# Patient Record
Sex: Female | Born: 1970 | Race: White | Hispanic: Yes | Marital: Married | Smoking: Never smoker
Health system: Southern US, Community
[De-identification: ages and names within clinical notes are randomized; demographics above are authoritative.]

## PROBLEM LIST (undated history)

## (undated) DIAGNOSIS — Z87442 Personal history of urinary calculi: Secondary | ICD-10-CM

## (undated) DIAGNOSIS — G4733 Obstructive sleep apnea (adult) (pediatric): Secondary | ICD-10-CM

## (undated) DIAGNOSIS — Z860100 Personal history of colon polyps, unspecified: Secondary | ICD-10-CM

## (undated) DIAGNOSIS — R7612 Nonspecific reaction to cell mediated immunity measurement of gamma interferon antigen response without active tuberculosis: Secondary | ICD-10-CM

## (undated) DIAGNOSIS — K635 Polyp of colon: Secondary | ICD-10-CM

## (undated) DIAGNOSIS — E039 Hypothyroidism, unspecified: Secondary | ICD-10-CM

## (undated) DIAGNOSIS — D259 Leiomyoma of uterus, unspecified: Secondary | ICD-10-CM

## (undated) DIAGNOSIS — N83209 Unspecified ovarian cyst, unspecified side: Secondary | ICD-10-CM

## (undated) DIAGNOSIS — N809 Endometriosis, unspecified: Secondary | ICD-10-CM

## (undated) DIAGNOSIS — T7840XA Allergy, unspecified, initial encounter: Secondary | ICD-10-CM

## (undated) DIAGNOSIS — E559 Vitamin D deficiency, unspecified: Secondary | ICD-10-CM

## (undated) DIAGNOSIS — E785 Hyperlipidemia, unspecified: Secondary | ICD-10-CM

## (undated) DIAGNOSIS — K219 Gastro-esophageal reflux disease without esophagitis: Secondary | ICD-10-CM

## (undated) DIAGNOSIS — J302 Other seasonal allergic rhinitis: Secondary | ICD-10-CM

## (undated) DIAGNOSIS — R0602 Shortness of breath: Secondary | ICD-10-CM

## (undated) DIAGNOSIS — E7800 Pure hypercholesterolemia, unspecified: Secondary | ICD-10-CM

## (undated) DIAGNOSIS — R102 Pelvic and perineal pain unspecified side: Secondary | ICD-10-CM

## (undated) DIAGNOSIS — Z86018 Personal history of other benign neoplasm: Secondary | ICD-10-CM

## (undated) DIAGNOSIS — N390 Urinary tract infection, site not specified: Secondary | ICD-10-CM

## (undated) DIAGNOSIS — K921 Melena: Secondary | ICD-10-CM

## (undated) DIAGNOSIS — N200 Calculus of kidney: Secondary | ICD-10-CM

## (undated) DIAGNOSIS — D219 Benign neoplasm of connective and other soft tissue, unspecified: Secondary | ICD-10-CM

## (undated) HISTORY — DX: Personal history of colon polyps, unspecified: Z86.0100

## (undated) HISTORY — DX: Gastro-esophageal reflux disease without esophagitis: K21.9

## (undated) HISTORY — DX: Personal history of other benign neoplasm: Z86.018

## (undated) HISTORY — DX: Personal history of urinary calculi: Z87.442

## (undated) HISTORY — DX: Allergy, unspecified, initial encounter: T78.40XA

## (undated) HISTORY — DX: Vitamin D deficiency, unspecified: E55.9

## (undated) HISTORY — DX: Hypothyroidism, unspecified: E03.9

## (undated) HISTORY — DX: Polyp of colon: K63.5

## (undated) HISTORY — DX: Shortness of breath: R06.02

## (undated) HISTORY — DX: Pure hypercholesterolemia, unspecified: E78.00

## (undated) HISTORY — DX: Personal history of colonic polyps: Z86.010

## (undated) HISTORY — DX: Hyperlipidemia, unspecified: E78.5

## (undated) HISTORY — DX: Morbid (severe) obesity due to excess calories: E66.01

## (undated) HISTORY — PX: DILATION AND CURETTAGE OF UTERUS: SHX78

## (undated) HISTORY — DX: Endometriosis, unspecified: N80.9

## (undated) HISTORY — DX: Urinary tract infection, site not specified: N39.0

## (undated) HISTORY — PX: UTERINE FIBROID EMBOLIZATION: SHX825

## (undated) HISTORY — DX: Melena: K92.1

## (undated) HISTORY — DX: Benign neoplasm of connective and other soft tissue, unspecified: D21.9

## (undated) HISTORY — DX: Other seasonal allergic rhinitis: J30.2

## (undated) HISTORY — DX: Nonspecific reaction to cell mediated immunity measurement of gamma interferon antigen response without active tuberculosis: R76.12

## (undated) HISTORY — DX: Calculus of kidney: N20.0

---

## 1999-01-16 HISTORY — PX: TUBAL LIGATION: SHX77

## 2005-01-15 HISTORY — PX: NASAL SINUS SURGERY: SHX719

## 2012-01-16 HISTORY — PX: UTERINE FIBROID EMBOLIZATION: SHX825

## 2013-07-22 ENCOUNTER — Ambulatory Visit (INDEPENDENT_AMBULATORY_CARE_PROVIDER_SITE_OTHER): Payer: 59 | Admitting: Family Medicine

## 2013-07-22 ENCOUNTER — Encounter: Payer: Self-pay | Admitting: Family Medicine

## 2013-07-22 VITALS — BP 122/84 | HR 72 | Temp 98.5°F | Ht 61.0 in | Wt 217.5 lb

## 2013-07-22 DIAGNOSIS — Z7689 Persons encountering health services in other specified circumstances: Secondary | ICD-10-CM

## 2013-07-22 DIAGNOSIS — Z7189 Other specified counseling: Secondary | ICD-10-CM

## 2013-07-22 DIAGNOSIS — E039 Hypothyroidism, unspecified: Secondary | ICD-10-CM

## 2013-07-22 DIAGNOSIS — N939 Abnormal uterine and vaginal bleeding, unspecified: Secondary | ICD-10-CM

## 2013-07-22 DIAGNOSIS — N926 Irregular menstruation, unspecified: Secondary | ICD-10-CM

## 2013-07-22 DIAGNOSIS — K625 Hemorrhage of anus and rectum: Secondary | ICD-10-CM

## 2013-07-22 DIAGNOSIS — E669 Obesity, unspecified: Secondary | ICD-10-CM

## 2013-07-22 LAB — CBC WITH DIFFERENTIAL/PLATELET
BASOS ABS: 0 10*3/uL (ref 0.0–0.1)
BASOS PCT: 0 % (ref 0–1)
EOS ABS: 0.1 10*3/uL (ref 0.0–0.7)
Eosinophils Relative: 2 % (ref 0–5)
HCT: 40 % (ref 36.0–46.0)
Hemoglobin: 13.6 g/dL (ref 12.0–15.0)
Lymphocytes Relative: 28 % (ref 12–46)
Lymphs Abs: 1.4 10*3/uL (ref 0.7–4.0)
MCH: 28.9 pg (ref 26.0–34.0)
MCHC: 34 g/dL (ref 30.0–36.0)
MCV: 85.1 fL (ref 78.0–100.0)
Monocytes Absolute: 0.5 10*3/uL (ref 0.1–1.0)
Monocytes Relative: 10 % (ref 3–12)
NEUTROS PCT: 60 % (ref 43–77)
Neutro Abs: 3.1 10*3/uL (ref 1.7–7.7)
Platelets: 317 10*3/uL (ref 150–400)
RBC: 4.7 MIL/uL (ref 3.87–5.11)
RDW: 13.2 % (ref 11.5–15.5)
WBC: 5.1 10*3/uL (ref 4.0–10.5)

## 2013-07-22 NOTE — Progress Notes (Signed)
No chief complaint on file.   HPI:  Kelli Ferrell is here to establish care.  Last PCP and physical: may 2014 - last mammo last year, last pap in 2013 - all normal in the past; is going to see gyn for physical  Has the following chronic problems and concerns today:  There are no active problems to display for this patient.  Hypothyroidism: -restarted levothyroxine in 2014, remote history of being on thyroid medicine in the past too, then off for several years -reports thyroid levels normal 1 year ago -denies: CP, constipation, skin changes, palpitations, hot/cold intolerance  Hx abnormal menstrual bleeding: -extensive evaluation in the past seeing gyn for this  Hx of painless BRBPR: -a lot of blood on TP and coating stool without BM or pain -has had this intermittently, several days last week more then usual -denies: NVD, constipation, weigh loss, fevers, hemorrhoids, rectal pain -occ constipation and hard stools/straining - but not when this occurred last week  ROS negative for unless reported above: fevers, unintentional weight loss, hearing or vision loss, chest pain, palpitations, struggling to breath, hemoptysis, hematuria, fals, loc, si, thoughts of self harm  Past Medical History  Diagnosis Date  . Hypothyroidism   . Kidney stones   . Blood in stool   . Urinary tract infection     Family History  Problem Relation Age of Onset  . Diabetes Mother   . Stroke Mother   . Hypertension Mother   . Diabetes Father   . Diabetes Brother     History   Social History  . Marital Status: Married    Spouse Name: N/A    Number of Children: N/A  . Years of Education: N/A   Social History Main Topics  . Smoking status: Never Smoker   . Smokeless tobacco: None  . Alcohol Use: Yes     Comment: 1 drink rarely  . Drug Use: No  . Sexual Activity: Yes    Birth Control/ Protection: Other-see comments     Comment: tubal   Other Topics Concern  . None   Social  History Narrative   Work or School: [employer] Situation: lives with husband and two children      Spiritual Beliefs: Christian      Lifestyle: regular exercise and healthy diet             Current outpatient prescriptions:levothyroxine (SYNTHROID, LEVOTHROID) 100 MCG tablet, Take 100 mcg by mouth daily before breakfast., Disp: , Rfl:   EXAM:  Filed Vitals:   07/22/13 0936  BP: 122/84  Pulse: 72  Temp: 98.5 F (36.9 C)    Body mass index is 41.12 kg/(m^2).  GENERAL: vitals reviewed and listed above, alert, oriented, appears well hydrated and in no acute distress  HEENT: atraumatic, conjunttiva clear, no obvious abnormalities on inspection of external nose and ears  NECK: no obvious masses on inspection  LUNGS: clear to auscultation bilaterally, no wheezes, rales or rhonchi, good air movement  CV: HRRR, no peripheral edema  RECTUM: normal exam, hemocult neg, hard stool in rectal vault  MS: moves all extremities without noticeable abnormality  PSYCH: pleasant and cooperative, no obvious depression or anxiety  ASSESSMENT AND PLAN:  Discussed the following assessment and plan:  Encounter to establish care  Unspecified hypothyroidism - Plan: TSH  BRBPR (bright red blood per rectum) - Plan: CBC with Differential, Ambulatory referral to Gastroenterology  Abnormal bleeding in menstrual cycle  Obesity, unspecified -  Plan: Lipid Panel, Hemoglobin A1c   -We reviewed the PMH, PSH, FH, SH, Meds and Allergies. -We provided refills for any medications we will prescribe as needed. -We addressed current concerns per orders and patient instructions. -We have asked for records for pertinent exams, studies, vaccines and notes from previous providers. -We have advised patient to follow up per instructions below.   -Patient advised to return or notify a doctor immediately if symptoms worsen or persist or new concerns arise.  Patient  Instructions  -We have ordered labs or studies at this visit. It can take up to 1-2 weeks for results and processing. We will contact you with instructions IF your results are abnormal. Normal results will be released to your [facility]. If you have not heard from [place] or can not find your results in [facility] in 2 weeks please contact our office.  -We placed a referral for you as discussed to the gastroenterologist for the bleeding. It usually takes about 1-2 weeks to process and schedule this referral. If you have not heard from [place] regarding this appointment in 2 weeks please contact our office.   -PLEASE SIGN UP FOR MYCHART TODAY   We recommend the following healthy lifestyle measures: - eat a healthy diet consisting of lots of vegetables, fruits, beans, nuts, seeds, healthy meats such as white chicken and fish and whole grains.  - avoid fried foods, fast food, processed foods, sodas, red meet and other fattening foods.  - get a least 150 minutes of aerobic exercise per week.   Follow up in: 3-4 months or as needed      Aylan Bayona R.

## 2013-07-22 NOTE — Addendum Note (Signed)
Addended by: Joyce Gross R on: 07/22/2013 10:24 AM   Modules accepted: Orders

## 2013-07-22 NOTE — Progress Notes (Signed)
Pre visit review using our clinic review tool, if applicable. No additional management support is needed unless otherwise documented below in the visit note. 

## 2013-07-22 NOTE — Patient Instructions (Signed)
-  We have ordered labs or studies at this visit. It can take up to 1-2 weeks for results and processing. We will contact you with instructions IF your results are abnormal. Normal results will be released to your [facility]. If you have not heard from [place] or can not find your results in [facility] in 2 weeks please contact our office.  -We placed a referral for you as discussed to the gastroenterologist for the bleeding. It usually takes about 1-2 weeks to process and schedule this referral. If you have not heard from [place] regarding this appointment in 2 weeks please contact our office.   -PLEASE SIGN UP FOR MYCHART TODAY   We recommend the following healthy lifestyle measures: - eat a healthy diet consisting of lots of vegetables, fruits, beans, nuts, seeds, healthy meats such as white chicken and fish and whole grains.  - avoid fried foods, fast food, processed foods, sodas, red meet and other fattening foods.  - get a least 150 minutes of aerobic exercise per week.   Follow up in: 3-4 months or as needed

## 2013-07-23 LAB — LIPID PANEL
CHOL/HDL RATIO: 4.8 ratio
Cholesterol: 159 mg/dL (ref 0–200)
HDL: 33 mg/dL — ABNORMAL LOW (ref >39)
LDL Cholesterol: 90 mg/dL (ref 0–99)
Triglycerides: 179 mg/dL — ABNORMAL HIGH (ref <150)
VLDL: 36 mg/dL (ref 0–40)

## 2013-07-23 LAB — HEMOGLOBIN A1C
Hgb A1c MFr Bld: 5.4 % (ref <5.7)
Mean Plasma Glucose: 108 mg/dL (ref <117)

## 2013-07-23 LAB — TSH: TSH: 3.774 u[IU]/mL (ref 0.350–4.500)

## 2013-08-17 ENCOUNTER — Encounter: Payer: Self-pay | Admitting: Family Medicine

## 2013-10-28 ENCOUNTER — Encounter: Payer: Self-pay | Admitting: Physician Assistant

## 2013-10-28 ENCOUNTER — Ambulatory Visit (INDEPENDENT_AMBULATORY_CARE_PROVIDER_SITE_OTHER): Payer: 59 | Admitting: Physician Assistant

## 2013-10-28 VITALS — BP 120/80 | HR 60 | Temp 98.1°F | Resp 18 | Wt 216.2 lb

## 2013-10-28 DIAGNOSIS — L259 Unspecified contact dermatitis, unspecified cause: Secondary | ICD-10-CM

## 2013-10-28 MED ORDER — PREDNISONE 10 MG PO TABS: ORAL_TABLET | ORAL | Status: DC

## 2013-10-28 NOTE — Progress Notes (Signed)
Subjective:    Patient ID: Kelli Ferrell, female    DOB: 1970/03/28, 43 y.o.   MRN: [id number]  Kelli Ferrell This is a new problem. The current episode started more than 1 month ago (started 1 month ago, went to UC initially.). The problem has been gradually worsening since onset. The rash is diffuse (chest, back, arms, abd, legs.). The rash is characterized by redness, itchiness and blistering. She was exposed to plant contact. Pertinent negatives include no anorexia, congestion, cough, diarrhea, eye pain, facial edema, fatigue, fever, joint pain, nail changes, rhinorrhea, shortness of breath, sore throat or vomiting. Treatments tried: benadryl, hydroxyzine, cortisone, calamine, 12 days course of prednisone. The treatment provided mild relief. There is no history of allergies, asthma or eczema.      Review of Systems  Constitutional: Negative for fever, chills and fatigue.  HENT: Negative for congestion, rhinorrhea and sore throat.   Eyes: Negative for pain.  Respiratory: Negative for cough and shortness of breath.   Cardiovascular: Negative for chest pain.  Gastrointestinal: Negative for nausea, vomiting, diarrhea and anorexia.  Musculoskeletal: Negative for joint pain.  Skin: Negative for nail changes.  Neurological: Negative for syncope and headaches.  All other systems reviewed and are negative.    Past Medical History  Diagnosis Date  . Hypothyroidism   . Kidney stones   . Blood in stool   . Urinary tract infection     History   Social History  . Marital Status: Married    Spouse Name: N/A    Number of Children: N/A  . Years of Education: N/A   Occupational History  . Not on file.   Social History Main Topics  . Smoking status: Never Smoker   . Smokeless tobacco: Not on file  . Alcohol Use: Yes     Comment: 1 drink rarely  . Drug Use: No  . Sexual Activity: Yes    Birth Control/ Protection: Other-see comments     Comment: tubal   Other Topics Concern    . 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 Dilation and curettage of uterus    . Uterine fibroid embolization      Family History  Problem Relation Age of Onset  . Diabetes Mother   . Stroke Mother   . Hypertension Mother   . Diabetes Father   . Diabetes Brother     No Known Allergies  Current Outpatient Prescriptions on File Prior to Visit  Medication Sig Dispense Refill  . levothyroxine (SYNTHROID, LEVOTHROID) 100 MCG tablet Take 100 mcg by mouth daily before breakfast.       No current facility-administered medications on file prior to visit.    EXAM: BP 120/80  Pulse 60  Temp(Src) 98.1 F (36.7 C) (Oral)  Resp 18  Wt 216 lb 3.2 oz (98.068 kg)     Objective:   Physical Exam  Nursing note and vitals reviewed. Constitutional: She is oriented to person, place, and time. She appears well-developed and well-nourished. No distress.  HENT:  Head: Normocephalic and atraumatic.  Eyes: Conjunctivae and EOM are normal.  Cardiovascular: Normal rate, regular rhythm and intact  distal pulses.   Pulmonary/Chest: Effort normal and breath sounds normal. No respiratory distress. She exhibits no tenderness.  Neurological: She is alert and oriented to person, place, and time.  Skin: Skin is warm and dry. Rash noted. She is not diaphoretic. No pallor.  Diffuse rash with occasional blistering covering the chest, back, arms, legs. Non ttp, no swelling. No fluctuance.  Psychiatric: She has a normal mood and affect. Her behavior is normal. Judgment and thought content normal.     Lab Results  Component Value Date   WBC 5.1 07/22/2013   HGB 13.6 07/22/2013   HCT 40.0 07/22/2013   PLT 317 07/22/2013   CHOL 159 07/22/2013   TRIG  179* 07/22/2013   HDL 33* 07/22/2013   LDLCALC 90 07/22/2013   TSH 3.774 07/22/2013   HGBA1C 5.4 07/22/2013        Assessment & Plan:  Kelli Ferrell was seen today for Kelli ivy.  Diagnoses and associated orders for this visit:  Contact dermatitis Comments: from Kelli ivy. Treat with another course of prednisone, watchful waiting. - predniSONE (DELTASONE) 10 MG tablet; 3 tablets twice daily for 3 days, then 2 tablets twice daily for 3 days,  then 1 tablet twice daily for 2 days, then one tablet daily  for 6 days    Return precautions provided, and patient handout on contact dermatitis.  Plan to follow up as needed, or for worsening or persistent symptoms despite treatment.  Patient Instructions  Prednisone as directed.  Continue try the topicals until you see relief from the prednisone.  If emergency symptoms discussed during visit developed, seek medical attention immediately.  Followup as needed, or for worsening or persistent symptoms despite treatment.

## 2013-10-28 NOTE — Patient Instructions (Addendum)
Prednisone as directed.  Continue try the topicals until you see relief from the prednisone.  If emergency symptoms discussed during visit developed, seek medical attention immediately.  Followup as needed, or for worsening or persistent symptoms despite treatment.   Contact Dermatitis Contact dermatitis is a rash that happens when something touches the skin. You touched something that irritates your skin, or you have allergies to something you touched. HOME CARE   Avoid the thing that caused your rash.  Keep your rash away from hot water, soap, sunlight, chemicals, and other things that might bother it.  Do not scratch your rash.  You can take cool baths to help stop itching.  Only take medicine as told by your doctor.  Keep all doctor visits as told. GET HELP RIGHT AWAY IF:   Your rash is not better after 3 days.  Your rash gets worse.  Your rash is puffy (swollen), tender, red, sore, or warm.  You have problems with your medicine. MAKE SURE YOU:   Understand these instructions.  Will watch your condition.  Will get help right away if you are not doing well or get worse. Document Released: 10/29/2008 Document Revised: 03/26/2011 Document Reviewed: 06/06/2010 [facility] Patient Information 2015 [place], [place]. This information is not intended to replace advice given to you by your health care provider. Make sure you discuss any questions you have with your health care provider.

## 2013-10-28 NOTE — Progress Notes (Signed)
Pre visit review using our clinic review tool, if applicable. No additional management support is needed unless otherwise documented below in the visit note. 

## 2014-03-03 ENCOUNTER — Other Ambulatory Visit (INDEPENDENT_AMBULATORY_CARE_PROVIDER_SITE_OTHER): Payer: Self-pay | Admitting: Otolaryngology

## 2014-03-03 DIAGNOSIS — J329 Chronic sinusitis, unspecified: Secondary | ICD-10-CM

## 2014-03-04 ENCOUNTER — Ambulatory Visit
Admission: RE | Admit: 2014-03-04 | Discharge: 2014-03-04 | Disposition: A | Discharge status: Home / Self Care | Payer: 59 | Source: Ambulatory Visit | Attending: Otolaryngology | Admitting: Otolaryngology

## 2014-03-04 DIAGNOSIS — J329 Chronic sinusitis, unspecified: Secondary | ICD-10-CM

## 2014-07-13 ENCOUNTER — Ambulatory Visit (INDEPENDENT_AMBULATORY_CARE_PROVIDER_SITE_OTHER): Payer: Commercial Managed Care - HMO | Admitting: Family Medicine

## 2014-07-13 ENCOUNTER — Encounter: Payer: Self-pay | Admitting: Family Medicine

## 2014-07-13 VITALS — BP 118/88 | HR 65 | Temp 98.5°F | Ht 61.0 in | Wt 211.0 lb

## 2014-07-13 DIAGNOSIS — R739 Hyperglycemia, unspecified: Secondary | ICD-10-CM

## 2014-07-13 DIAGNOSIS — E039 Hypothyroidism, unspecified: Secondary | ICD-10-CM

## 2014-07-13 DIAGNOSIS — E669 Obesity, unspecified: Secondary | ICD-10-CM | POA: Diagnosis not present

## 2014-07-13 DIAGNOSIS — E785 Hyperlipidemia, unspecified: Secondary | ICD-10-CM | POA: Diagnosis not present

## 2014-07-13 LAB — LIPID PANEL
Cholesterol: 149 mg/dL (ref 0–200)
HDL: 30.4 mg/dL — ABNORMAL LOW (ref >39.00)
LDL CALC: 91 mg/dL (ref 0–99)
NonHDL: 118.6
TRIGLYCERIDES: 139 mg/dL (ref 0.0–149.0)
Total CHOL/HDL Ratio: 5
VLDL: 27.8 mg/dL (ref 0.0–40.0)

## 2014-07-13 LAB — HEMOGLOBIN A1C: HEMOGLOBIN A1C: 5.3 % (ref 4.6–6.5)

## 2014-07-13 LAB — TSH: TSH: 2.75 u[IU]/mL (ref 0.35–4.50)

## 2014-07-13 NOTE — Progress Notes (Signed)
  HPI:  Hypothyroidism: -restarted levothyroxine in 2014, remote history of being on thyroid medicine in the past too, then off for several years -taking 12mcg daily on empty stomach -denies: CP, constipation, skin changes, palpitations, hot/cold intolerance  Obesity/HLD: -working on exercise; working fruits and veggies, portion control -has lost 6lbs in the last few months -wants to check hgba1c, fh diabetes -denies: polyuria, polydipsia, vision changes   ROS: See pertinent positives and negatives per HPI.  Past Medical History  Diagnosis Date  . Hypothyroidism   . Kidney stones   . Blood in stool   . Urinary tract infection     Past Surgical History  Procedure Laterality Date  . Dilation and curettage of uterus    . Uterine fibroid embolization      Family History  Problem Relation Age of Onset  . Diabetes Mother   . Stroke Mother   . Hypertension Mother   . Diabetes Father   . Diabetes Brother     History   Social History  . Marital Status: Married    Spouse Name: N/A  . Number of Children: N/A  . Years of Education: N/A   Social History Main Topics  . Smoking status: Never Smoker   . Smokeless tobacco: Not on file  . Alcohol Use: Yes     Comment: 1 drink rarely  . Drug Use: No  . Sexual Activity: Yes    Birth Control/ Protection: Other-see comments     Comment: tubal   Other Topics Concern  . None   Social History Narrative   Work or School: [employer] Situation: lives with husband and two children      Spiritual Beliefs: Christian      Lifestyle: regular exercise and healthy diet              Current outpatient prescriptions:  .  levothyroxine (SYNTHROID, LEVOTHROID) 100 MCG tablet, Take 100 mcg by mouth daily before breakfast., Disp: , Rfl:   EXAM:  Filed Vitals:   07/13/14 0910  BP: 118/88  Pulse: 65  Temp: 98.5 F (36.9 C)    Body mass index is 39.89 kg/(m^2).  GENERAL: vitals reviewed  and listed above, alert, oriented, appears well hydrated and in no acute distress  HEENT: atraumatic, conjunttiva clear, no obvious abnormalities on inspection of external nose and ears  NECK: no obvious masses on inspection  LUNGS: clear to auscultation bilaterally, no wheezes, rales or rhonchi, good air movement  CV: HRRR, no peripheral edema  MS: moves all extremities without noticeable abnormality  PSYCH: pleasant and cooperative, no obvious depression or anxiety  ASSESSMENT AND PLAN:  Discussed the following assessment and plan:  Hypothyroidism, unspecified hypothyroidism type - Plan: TSH  Obesity  Hyperlipemia - Plan: Lipid Panel  Hyperglycemia - Plan: Hemoglobin A1c  -FASTING labs today -adjust synthroid if needed -follow up in 6 months -Patient advised to return or notify a doctor immediately if symptoms worsen or persist or new concerns arise.  Patient Instructions  BEFORE YOU LEAVE: -labs -follow up as scheduled     Vineeth Fell R.

## 2014-07-13 NOTE — Progress Notes (Signed)
Pre visit review using our clinic review tool, if applicable. No additional management support is needed unless otherwise documented below in the visit note. 

## 2014-07-13 NOTE — Patient Instructions (Addendum)
BEFORE YOU LEAVE: -labs -follow up as scheduled  -We have ordered labs or studies at this visit. It can take up to 1-2 weeks for results and processing. We will contact you with instructions IF your results are abnormal. Normal results will be released to your [facility]. If you have not heard from [place] or can not find your results in [facility] in 2 weeks please contact our office.  We recommend the following healthy lifestyle measures: - eat a healthy diet consisting of lots of vegetables, fruits, beans, nuts, seeds, healthy meats such as white chicken and fish and whole grains.  - avoid fried foods, fast food, processed foods, sodas, red meet and other fattening foods.  - get a least 150 minutes of aerobic exercise per week.

## 2014-07-14 MED ORDER — LEVOTHYROXINE SODIUM 100 MCG PO TABS: 100.0000 ug | ORAL_TABLET | Freq: Every day | ORAL | Status: DC

## 2014-07-14 NOTE — Addendum Note (Signed)
Addended by: Agnes Lawrence on: 07/14/2014 08:14 AM   Modules accepted: Orders

## 2014-09-16 ENCOUNTER — Other Ambulatory Visit: Payer: Self-pay | Admitting: Family Medicine

## 2014-10-01 ENCOUNTER — Other Ambulatory Visit: Payer: Self-pay | Admitting: Obstetrics and Gynecology

## 2014-10-01 DIAGNOSIS — R928 Other abnormal and inconclusive findings on diagnostic imaging of breast: Secondary | ICD-10-CM

## 2014-10-04 ENCOUNTER — Ambulatory Visit (INDEPENDENT_AMBULATORY_CARE_PROVIDER_SITE_OTHER): Payer: Commercial Managed Care - HMO | Admitting: Family Medicine

## 2014-10-04 ENCOUNTER — Encounter: Payer: Self-pay | Admitting: Family Medicine

## 2014-10-04 VITALS — BP 136/80 | HR 92 | Temp 97.9°F | Ht 61.0 in

## 2014-10-04 DIAGNOSIS — L500 Allergic urticaria: Secondary | ICD-10-CM

## 2014-10-04 MED ORDER — EPINEPHRINE 0.3 MG/0.3ML IJ SOAJ: 0.3000 mg | Freq: Once | INTRAMUSCULAR | Status: DC

## 2014-10-04 NOTE — Patient Instructions (Signed)
Pepcid daily  Continue steroid course to complete  Call for appointment with allergist  Seek emergency care immediately if difficulty breathing, swelling of throat, neck or face, gastrointestinal distress with severe allergic reaction  Hives Hives are itchy, red, swollen areas of the skin. They can vary in size and location on your body. Hives can come and go for hours or several days (acute hives) or for several weeks (chronic hives). Hives do not spread from person to person (noncontagious). They may get worse with scratching, exercise, and emotional stress. CAUSES   Allergic reaction to food, additives, or drugs.  Infections, including the common cold.  Illness, such as vasculitis, lupus, or thyroid disease.  Exposure to sunlight, heat, or cold.  Exercise.  Stress.  Contact with chemicals. SYMPTOMS   Red or white swollen patches on the skin. The patches may change size, shape, and location quickly and repeatedly.  Itching.  Swelling of the hands, feet, and face. This may occur if hives develop deeper in the skin. DIAGNOSIS  Your caregiver can usually tell what is wrong by performing a physical exam. Skin or blood tests may also be done to determine the cause of your hives. In some cases, the cause cannot be determined. TREATMENT  Mild cases usually get better with medicines such as antihistamines. Severe cases may require an emergency epinephrine injection. If the cause of your hives is known, treatment includes avoiding that trigger.  HOME CARE INSTRUCTIONS   Avoid causes that trigger your hives.  Take antihistamines as directed by your caregiver to reduce the severity of your hives. Non-sedating or low-sedating antihistamines are usually recommended. Do not drive while taking an antihistamine.  Take any other medicines prescribed for itching as directed by your caregiver.  Wear loose-fitting clothing.  Keep all follow-up appointments as directed by your  caregiver. SEEK MEDICAL CARE IF:   You have persistent or severe itching that is not relieved with medicine.  You have painful or swollen joints. SEEK IMMEDIATE MEDICAL CARE IF:   You have a fever.  Your tongue or lips are swollen.  You have trouble breathing or swallowing.  You feel tightness in the throat or chest.  You have abdominal pain. These problems may be the first sign of a life-threatening allergic reaction. Call your local emergency services (911 in [place] MAKE SURE YOU:   Understand these instructions.  Will watch your condition.  Will get help right away if you are not doing well or get worse. Document Released: 01/01/2005 Document Revised: 01/06/2013 Document Reviewed: 03/27/2011 [facility] Patient Information 2015 [place], [place]. This information is not intended to replace advice given to you by your health care provider. Make sure you discuss any questions you have with your health care provider.

## 2014-10-04 NOTE — Progress Notes (Signed)
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  Past Medical History  Diagnosis Date  . Hypothyroidism   . Kidney stones   . Blood in stool   . Urinary tract infection     Past Surgical History  Procedure Laterality Date  . Dilation and curettage of uterus    . Uterine fibroid embolization      Family History  Problem Relation Age of Onset  . Diabetes Mother   . Stroke Mother   . Hypertension Mother   . Diabetes Father   . Diabetes Brother     Social History   Social History  . Marital Status: Married    Spouse Name: N/A  . Number of Children: N/A  . Years of Education: N/A   Social History Main Topics  . Smoking status: Never Smoker   . Smokeless tobacco: None  . Alcohol Use: Yes     Comment: 1 drink rarely  . Drug Use: No  . Sexual Activity: Yes    Birth Control/ Protection: Other-see comments     Comment: tubal   Other Topics Concern  . None   Social History Narrative   Work or School: [employer] Situation: lives with husband and two children      Spiritual Beliefs: Christian      Lifestyle: regular exercise and healthy diet              Current outpatient prescriptions:  .  diphenhydrAMINE (SOMINEX) 25 MG tablet, Take 25 mg by mouth as needed for sleep., Disp: , Rfl:  .  fluconazole (DIFLUCAN) 150 MG tablet, TK 1 T PO QD, Disp: , Rfl: 0 .  levothyroxine (SYNTHROID, LEVOTHROID) 100 MCG tablet, Take 1  tablet (100 mcg total) by mouth daily before breakfast., Disp: 30 tablet, Rfl: 1 .  methylPREDNISolone (MEDROL DOSEPAK) 4 MG TBPK tablet, See admin instructions. follow package directions, Disp: , Rfl: 0 .  EPINEPHrine (EPIPEN 2-PAK) 0.3 mg/0.3 mL IJ SOAJ injection, Inject 0.3 mLs (0.3 mg total) into the muscle once., Disp: 1 Device, Rfl: 0  EXAM:  Filed Vitals:   10/04/14 0846  BP: 136/80  Pulse: 92  Temp: 97.9 F (36.6 C)    There is no weight on file to calculate BMI.  GENERAL: vitals reviewed and listed above, alert, oriented, appears well hydrated and in no acute distress  HEENT: atraumatic, conjunttiva clear, no obvious abnormalities on inspection of external nose and ears, no swelling of lips, face, tongue or oropharynx, does have clear rhinorrhea, PND  NECK: no obvious masses on inspection, no swelling, no bruit or pseudowheeze  LUNGS: clear to auscultation bilaterally, no wheezes, rales or rhonchi, good air movement  CV: HRRR, no peripheral edema  SKIN: difuse hives  MS: moves all extremities without noticeable abnormality  PSYCH: pleasant and cooperative, no obvious depression or anxiety  ASSESSMENT AND PLAN:  Discussed the following assessment and plan:  Allergic urticaria  -etiology unknown -advised avoidance of common allergens for now, pepcid,  complete steroid course, follow up with allergist -rx for epipen though advised of risks and proper use and advised to call 911 if serious reaction requiring epipen  -emergency recs -Patient advised to return or notify a doctor immediately if symptoms worsen or persist or new concerns arise.  Patient Instructions  Pepcid daily  Continue steroid course to complete  Call for appointment with allergist  Seek emergency care immediately if difficulty breathing, swelling of throat, neck or face, gastrointestinal distress with severe allergic reaction  Hives Hives are itchy, red, swollen areas of the skin. They can  vary in size and location on your body. Hives can come and go for hours or several days (acute hives) or for several weeks (chronic hives). Hives do not spread from person to person (noncontagious). They may get worse with scratching, exercise, and emotional stress. CAUSES   Allergic reaction to food, additives, or drugs.  Infections, including the common cold.  Illness, such as vasculitis, lupus, or thyroid disease.  Exposure to sunlight, heat, or cold.  Exercise.  Stress.  Contact with chemicals. SYMPTOMS   Red or white swollen patches on the skin. The patches may change size, shape, and location quickly and repeatedly.  Itching.  Swelling of the hands, feet, and face. This may occur if hives develop deeper in the skin. DIAGNOSIS  Your caregiver can usually tell what is wrong by performing a physical exam. Skin or blood tests may also be done to determine the cause of your hives. In some cases, the cause cannot be determined. TREATMENT  Mild cases usually get better with medicines such as antihistamines. Severe cases may require an emergency epinephrine injection. If the cause of your hives is known, treatment includes avoiding that trigger.  HOME CARE INSTRUCTIONS   Avoid causes that trigger your hives.  Take antihistamines as directed by your caregiver to reduce the severity of your hives. Non-sedating or low-sedating antihistamines are usually recommended. Do not drive while taking an antihistamine.  Take any other medicines prescribed for itching as directed by your caregiver.  Wear loose-fitting clothing.  Keep all follow-up appointments as directed by your caregiver. SEEK MEDICAL CARE IF:   You have persistent or severe itching that is not relieved with medicine.  You have painful or swollen joints. SEEK IMMEDIATE MEDICAL CARE IF:   You have a fever.  Your tongue or lips are swollen.  You have trouble breathing or swallowing.  You feel tightness in the  throat or chest.  You have abdominal pain. These problems may be the first sign of a life-threatening allergic reaction. Call your local emergency services (911 in [place] MAKE SURE YOU:   Understand these instructions.  Will watch your condition.  Will get help right away if you are not doing well or get worse. Document Released: 01/01/2005 Document Revised: 01/06/2013 Document Reviewed: 03/27/2011 [facility] Patient Information 2015 [place], [place]. This information is not intended to replace advice given to you by your health care provider. Make sure you discuss any questions you have with your health care provider.        Colin Benton R.

## 2014-10-04 NOTE — Progress Notes (Signed)
Pre visit review using our clinic review tool, if applicable. No additional management support is needed unless otherwise documented below in the visit note. 

## 2014-10-11 ENCOUNTER — Ambulatory Visit
Admission: RE | Admit: 2014-10-11 | Discharge: 2014-10-11 | Disposition: A | Discharge status: Home / Self Care | Payer: Commercial Managed Care - HMO | Source: Ambulatory Visit | Attending: Obstetrics and Gynecology | Admitting: Obstetrics and Gynecology

## 2014-10-11 ENCOUNTER — Other Ambulatory Visit: Payer: Self-pay | Admitting: Obstetrics and Gynecology

## 2014-10-11 DIAGNOSIS — R928 Other abnormal and inconclusive findings on diagnostic imaging of breast: Secondary | ICD-10-CM

## 2014-10-19 ENCOUNTER — Ambulatory Visit
Admission: RE | Admit: 2014-10-19 | Discharge: 2014-10-19 | Disposition: A | Discharge status: Home / Self Care | Payer: Commercial Managed Care - HMO | Source: Ambulatory Visit | Attending: Obstetrics and Gynecology | Admitting: Obstetrics and Gynecology

## 2014-10-19 ENCOUNTER — Other Ambulatory Visit: Payer: Self-pay | Admitting: Obstetrics and Gynecology

## 2014-10-19 ENCOUNTER — Encounter: Payer: 59 | Admitting: Family Medicine

## 2014-10-19 DIAGNOSIS — R928 Other abnormal and inconclusive findings on diagnostic imaging of breast: Secondary | ICD-10-CM

## 2014-11-18 ENCOUNTER — Ambulatory Visit (INDEPENDENT_AMBULATORY_CARE_PROVIDER_SITE_OTHER): Payer: Commercial Managed Care - HMO | Admitting: Family Medicine

## 2014-11-18 ENCOUNTER — Other Ambulatory Visit: Payer: Self-pay

## 2014-11-18 ENCOUNTER — Encounter: Payer: Self-pay | Admitting: Family Medicine

## 2014-11-18 VITALS — BP 102/80 | HR 71 | Temp 98.6°F | Ht 60.75 in | Wt 216.5 lb

## 2014-11-18 DIAGNOSIS — E039 Hypothyroidism, unspecified: Secondary | ICD-10-CM

## 2014-11-18 DIAGNOSIS — Z23 Encounter for immunization: Secondary | ICD-10-CM

## 2014-11-18 DIAGNOSIS — E785 Hyperlipidemia, unspecified: Secondary | ICD-10-CM

## 2014-11-18 DIAGNOSIS — I519 Heart disease, unspecified: Secondary | ICD-10-CM

## 2014-11-18 DIAGNOSIS — Z0000 Encounter for general adult medical examination without abnormal findings: Secondary | ICD-10-CM | POA: Diagnosis not present

## 2014-11-18 HISTORY — DX: Morbid (severe) obesity due to excess calories: E66.01

## 2014-11-18 HISTORY — DX: Hyperlipidemia, unspecified: E78.5

## 2014-11-18 LAB — TSH: TSH: 1.69 u[IU]/mL (ref 0.35–4.50)

## 2014-11-18 NOTE — Addendum Note (Signed)
Addended by: Gari Crown D on: 11/18/2014 02:14 PM   Modules accepted: Orders

## 2014-11-18 NOTE — Progress Notes (Signed)
HPI:  Here for CPE:  -Concerns and/or follow up today:   Hypothyroidism: -restarted levothyroxine in 2014, remote history of being on thyroid medicine in the past too, then off for several years -taking 181mcg daily on empty stomach -denies: CP, constipation, skin changes, palpitations, hot/cold intolerance  Obesity/HLD: -Diet: variety of foods, balance and well rounded, larger portion sizes -Exercise: walking daily  -Taking folic acid, vitamin D or calcium: no  -Diabetes and Dyslipidemia Screening: done  -Hx of HTN: no  -Vaccines: flu shot today  -pap history: pap 08/2014 with gyn, Dr. Terri Piedra, normal per pt report   -FDLMP: 11/03/14  -sexual activity: yes, female partner, no new partners  -wants STI testing (Hep C if born [birth year]): no  -FH breast, colon or ovarian ca: see FH Last mammogram: about 1 month ago, reports told did not need biopsy when went for biopsy and told to repeat mammo in 6 months   -Alcohol, Tobacco, drug use: see social history  Review of Systems - no fevers, unintentional weight loss, vision loss, hearing loss, chest pain, sob, hemoptysis, melena, hematochezia, hematuria, genital discharge, changing or concerning skin lesions, bleeding, bruising, loc, thoughts of self harm or SI  Past Medical History  Diagnosis Date  . Hypothyroidism   . Kidney stones   . Blood in stool   . Urinary tract infection     Past Surgical History  Procedure Laterality Date  . Dilation and curettage of uterus    . Uterine fibroid embolization      Family History  Problem Relation Age of Onset  . Diabetes Mother   . Stroke Mother   . Hypertension Mother   . Diabetes Father   . Diabetes Brother     Social History   Social History  . Marital Status: Married    Spouse Name: N/A  . Number of Children: N/A  . Years of Education: N/A   Social History Main Topics  . Smoking status: Never Smoker   . Smokeless tobacco: None  . Alcohol Use: Yes     Comment: 1  drink rarely  . Drug Use: No  . Sexual Activity: Yes    Birth Control/ Protection: Other-see comments     Comment: tubal   Other Topics Concern  . None   Social History Narrative   Work or School: [employer] Situation: lives with husband and two children      Spiritual Beliefs: Christian      Lifestyle: regular exercise and healthy diet              Current outpatient prescriptions:  .  EPINEPHrine (EPIPEN 2-PAK) 0.3 mg/0.3 mL IJ SOAJ injection, Inject 0.3 mLs (0.3 mg total) into the muscle once., Disp: 1 Device, Rfl: 0 .  fluticasone (FLONASE) 50 MCG/ACT nasal spray, Place into both nostrils daily., Disp: , Rfl:  .  levothyroxine (SYNTHROID, LEVOTHROID) 100 MCG tablet, Take 1 tablet (100 mcg total) by mouth daily before breakfast., Disp: 30 tablet, Rfl: 1  EXAM:  Filed Vitals:   11/18/14 1326  BP: 102/80  Pulse: 71  Temp: 98.6 F (37 C)   Body mass index is 41.25 kg/(m^2).   GENERAL: vitals reviewed and listed below, alert, oriented, appears well hydrated and in no acute distress  HEENT: head atraumatic, PERRLA, normal appearance of eyes, ears, nose and mouth. moist mucus membranes.  NECK: supple, no masses or lymphadenopathy  LUNGS: clear to auscultation bilaterally, no rales, rhonchi or  wheeze  CV: HRRR, no peripheral edema or cyanosis, normal pedal pulses  BREAST: declined  ABDOMEN: bowel sounds normal, soft, non tender to palpation, no masses, no rebound or guarding  GU: declined  SKIN: declined to get undressed for full skin check, no abnormal lesions seen on exposed areas  MS: normal gait, moves all extremities normally  NEURO: CN II-XII grossly intact, normal muscle strength and sensation to light touch on extremities  PSYCH: normal affect, pleasant and cooperative  ASSESSMENT AND PLAN:  Discussed the following assessment and plan:  Visit for preventive health examination  Hypothyroidism, unspecified  hypothyroidism type  Morbid obesity, unspecified obesity type ([place])  Dyslipidemia   -Discussed and advised all [place] preventive services health task force level A and B recommendations for age, sex and risks.  -Advised at least 150 minutes of exercise per week and a healthy diet low in saturated fats and sweets and consisting of fresh fruits and vegetables, lean meats such as fish and white chicken and whole grains.  -labs, studies and vaccines per orders this encounter  No orders of the defined types were placed in this encounter.    Patient advised to return to clinic immediately if symptoms worsen or persist or new concerns.  Patient Instructions  BEFORE YOU LEAVE: -flu shot -lab - for thyroid check -follow up in 6 months  We recommend the following healthy lifestyle measures: - eat a healthy whole foods diet consisting of regular small meals composed of vegetables, fruits, beans, nuts, seeds, healthy meats such as white chicken and fish and whole grains.  - avoid sweets, white starchy foods, fried foods, fast food, processed foods, sodas, red meet and other fattening foods.  - get a least 150-300 minutes of aerobic exercise per week.   -We have ordered labs or studies at this visit. It can take up to 1-2 weeks for results and processing. We will contact you with instructions IF your results are abnormal. Normal results will be released to your [facility]. If you have not heard from [place] or can not find your results in [facility] in 2 weeks please contact our office.           No Follow-up on file.  Colin Benton R.

## 2014-11-18 NOTE — Addendum Note (Signed)
Addended by: Agnes Lawrence on: 11/18/2014 02:10 PM   Modules accepted: Orders

## 2014-11-18 NOTE — Progress Notes (Signed)
Pre visit review using our clinic review tool, if applicable. No additional management support is needed unless otherwise documented below in the visit note. 

## 2014-11-18 NOTE — Patient Instructions (Signed)
BEFORE YOU LEAVE: -flu shot -lab - for thyroid check -follow up in 6 months  We recommend the following healthy lifestyle measures: - eat a healthy whole foods diet consisting of regular small meals composed of vegetables, fruits, beans, nuts, seeds, healthy meats such as white chicken and fish and whole grains.  - avoid sweets, white starchy foods, fried foods, fast food, processed foods, sodas, red meet and other fattening foods.  - get a least 150-300 minutes of aerobic exercise per week.   -We have ordered labs or studies at this visit. It can take up to 1-2 weeks for results and processing. We will contact you with instructions IF your results are abnormal. Normal results will be released to your [facility]. If you have not heard from [place] or can not find your results in [facility] in 2 weeks please contact our office.

## 2014-11-25 ENCOUNTER — Other Ambulatory Visit: Payer: Self-pay | Admitting: Family Medicine

## 2015-01-16 DIAGNOSIS — Z9289 Personal history of other medical treatment: Secondary | ICD-10-CM

## 2015-01-16 HISTORY — DX: Personal history of other medical treatment: Z92.89

## 2015-04-18 ENCOUNTER — Other Ambulatory Visit: Payer: Self-pay | Admitting: Obstetrics and Gynecology

## 2015-04-18 DIAGNOSIS — N63 Unspecified lump in unspecified breast: Secondary | ICD-10-CM

## 2015-04-28 ENCOUNTER — Ambulatory Visit
Admission: RE | Admit: 2015-04-28 | Discharge: 2015-04-28 | Disposition: A | Discharge status: Home / Self Care | Payer: Commercial Managed Care - HMO | Source: Ambulatory Visit | Attending: Obstetrics and Gynecology | Admitting: Obstetrics and Gynecology

## 2015-04-28 DIAGNOSIS — N63 Unspecified lump in unspecified breast: Secondary | ICD-10-CM

## 2015-09-07 ENCOUNTER — Other Ambulatory Visit: Payer: Self-pay | Admitting: Family Medicine

## 2015-11-15 ENCOUNTER — Other Ambulatory Visit: Payer: Self-pay | Admitting: Family Medicine

## 2015-11-15 ENCOUNTER — Other Ambulatory Visit: Payer: Self-pay | Admitting: Obstetrics and Gynecology

## 2015-11-15 DIAGNOSIS — Z1231 Encounter for screening mammogram for malignant neoplasm of breast: Secondary | ICD-10-CM

## 2015-11-22 ENCOUNTER — Encounter: Payer: Self-pay | Admitting: Family Medicine

## 2015-11-22 ENCOUNTER — Encounter: Payer: Commercial Managed Care - HMO | Admitting: Family Medicine

## 2015-11-22 DIAGNOSIS — J302 Other seasonal allergic rhinitis: Secondary | ICD-10-CM

## 2015-11-22 HISTORY — DX: Other seasonal allergic rhinitis: J30.2

## 2015-11-22 NOTE — Progress Notes (Deleted)
HPI:  Here for CPE:  -Concerns and/or follow up today: She has a PMH significant for hypothyroidism, Obesity, seasonal allergies and Hyperlipidemia. Medications include levothyroxine 158mcg daily and flonase seasonally.   -Diet: variety of foods, balance and well rounded, larger portion sizes  -Exercise: no regular exercise  -Taking folic acid, vitamin D or calcium: no  -Diabetes and Dyslipidemia Screening: FASTING for labs today  -Vaccines: reviewed  -pap history: sees gyn, Dr. Terri Piedra, last pap 08/2014 and normal per report  -FDLMP:  -sexual activity: yes, female partner, no new partners  -wants STI testing (Hep C if born [birth year]): no  -FH breast, colon or ovarian ca: see FH Last mammogram: schedule for next month Last colon cancer screening: n/a  Breast Ca Risk Assessment: -see family and personal hx, does women's and breast health with her gynecologist.   -Alcohol, Tobacco, drug use: see social history  Review of Systems - no fevers, unintentional weight loss, vision loss, hearing loss, chest pain, sob, hemoptysis, melena, hematochezia, hematuria, genital discharge, changing or concerning skin lesions, bleeding, bruising, loc, thoughts of self harm or SI  Past Medical History:  Diagnosis Date  . Blood in stool   . Dyslipidemia 11/18/2014  . Hypothyroidism   . Kidney stones   . Morbid obesity ([place]) 11/18/2014  . Seasonal allergies 11/22/2015  . Urinary tract infection     Past Surgical History:  Procedure Laterality Date  . DILATION AND CURETTAGE OF UTERUS    . UTERINE FIBROID EMBOLIZATION      Family History  Problem Relation Age of Onset  . Diabetes Mother   . Stroke Mother   . Hypertension Mother   . Diabetes Father   . Diabetes Brother     Social History   Social History  . Marital status: Married    Spouse name: N/A  . Number of children: N/A  . Years of education: N/A   Social History Main Topics  . Smoking status: Never Smoker  . Smokeless  tobacco: Not on file  . Alcohol use Yes     Comment: 1 drink rarely  . Drug use: No  . Sexual activity: Yes    Birth control/ protection: Other-see comments     Comment: tubal   Other Topics Concern  . 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  EPINEPHrine (EPIPEN 2-PAK) 0.3 mg/0.3 mL IJ SOAJ injection, Inject 0.3 mLs (0.3 mg total) into the muscle once., Disp: 1 Device, Rfl: 0 .  fluticasone (FLONASE) 50 MCG/ACT nasal spray, Place into both nostrils daily., Disp: , Rfl:  .  levothyroxine (SYNTHROID, LEVOTHROID) 100 MCG tablet, Take 1 tablet by mouth  daily before breakfast, Disp: 90 tablet, Rfl: 1  EXAM:  There were no vitals filed for this visit.  GENERAL: vitals reviewed and listed below, alert, oriented, appears well hydrated and in no acute distress  HEENT: head atraumatic, PERRLA, normal appearance of eyes, ears, nose and mouth. moist mucus membranes.  NECK: supple, no masses or lymphadenopathy  LUNGS: clear to auscultation bilaterally, no rales, rhonchi or wheeze  CV: HRRR, no peripheral edema or cyanosis, normal pedal pulses  BREAST: normal appearance - no  lesions or discharge, on palpation normal breast tissue without any suspicious masses  ABDOMEN: bowel sounds normal, soft, non tender to palpation, no masses, no rebound or guarding  GU: normal appearance of external genitalia - no lesions or masses, normal vaginal mucosa - no abnormal discharge, normal appearance of cervix - no lesions or abnormal discharge, no masses or tenderness on palpation of uterus and ovaries.  RECTAL: refused  SKIN: no rash or abnormal lesions  MS: normal gait, moves all extremities normally  NEURO: normal gait, speech and thought  processing grossly intact, muscle tone grossly intact throughout  PSYCH: normal affect, pleasant and cooperative  ASSESSMENT AND PLAN:  Discussed the following assessment and plan:  There are no diagnoses linked to this encounter.  -Discussed and advised all [place] preventive services health task force level A and B recommendations for age, sex and risks.  -Advised at least 150 minutes of exercise per week and a healthy diet with avoidance of (less then 1 serving per week) processed foods, white starches, red meat, fast foods and sweets and consisting of: * 5-9 servings of fresh fruits and vegetables (not corn or potatoes) *nuts and seeds, beans *olives and olive oil *lean meats such as fish and white chicken  *whole grains  -women's health exam with gyn advised  -mammogram advised - already scheduled  -flu vaccine advised  -labs advised, studies and vaccines per orders this encounter  No orders of the defined types were placed in this encounter.   Patient advised to return to clinic immediately if symptoms worsen or persist or new concerns.  There are no Patient Instructions on file for this visit.  No Follow-up on file.  Colin Benton R., DO

## 2015-11-28 ENCOUNTER — Ambulatory Visit (INDEPENDENT_AMBULATORY_CARE_PROVIDER_SITE_OTHER): Payer: Commercial Managed Care - HMO | Admitting: Family Medicine

## 2015-11-28 DIAGNOSIS — R7611 Nonspecific reaction to tuberculin skin test without active tuberculosis: Secondary | ICD-10-CM

## 2015-11-28 DIAGNOSIS — Z23 Encounter for immunization: Secondary | ICD-10-CM

## 2015-11-30 ENCOUNTER — Ambulatory Visit (INDEPENDENT_AMBULATORY_CARE_PROVIDER_SITE_OTHER)
Admission: RE | Admit: 2015-11-30 | Discharge: 2015-11-30 | Disposition: A | Discharge status: Home / Self Care | Payer: Commercial Managed Care - HMO | Source: Ambulatory Visit | Attending: Family Medicine | Admitting: Family Medicine

## 2015-11-30 DIAGNOSIS — R7611 Nonspecific reaction to tuberculin skin test without active tuberculosis: Secondary | ICD-10-CM | POA: Diagnosis not present

## 2015-11-30 NOTE — Progress Notes (Addendum)
Pt presented on 10/30/15 for PPD skin test reading. Pt had a 3mmx2.5mm welt/wheal. 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 Conferred with Dr. Regis Bill who agrees. CXR ordered. Pt going to [place] office today to have done. Pt has appt with Dr. Maudie Mercury already scheduled for tomorrow.   Pt saw Dr. Maudie Mercury today and she determined the reading is NOT positive. Pt is requesting a Quantiferon Gold test just to make sure.

## 2015-11-30 NOTE — Progress Notes (Signed)
Thank you for the information. Am I reading this correctly? I believe 2.5 mm of induration is rarely considered positive. Can you clarify? Also can you please code this this visit? I am not sure why it was routed to me to close and I put no chart in LOS to be able to close and route it.

## 2015-12-01 ENCOUNTER — Encounter: Payer: Self-pay | Admitting: Family Medicine

## 2015-12-01 ENCOUNTER — Encounter: Payer: Self-pay | Admitting: *Deleted

## 2015-12-01 ENCOUNTER — Ambulatory Visit (INDEPENDENT_AMBULATORY_CARE_PROVIDER_SITE_OTHER): Payer: Commercial Managed Care - HMO | Admitting: Family Medicine

## 2015-12-01 VITALS — BP 118/78 | HR 66 | Temp 98.5°F | Ht 61.0 in | Wt 211.7 lb

## 2015-12-01 DIAGNOSIS — Z0000 Encounter for general adult medical examination without abnormal findings: Secondary | ICD-10-CM | POA: Diagnosis not present

## 2015-12-01 DIAGNOSIS — E785 Hyperlipidemia, unspecified: Secondary | ICD-10-CM | POA: Diagnosis not present

## 2015-12-01 DIAGNOSIS — Z23 Encounter for immunization: Secondary | ICD-10-CM | POA: Diagnosis not present

## 2015-12-01 DIAGNOSIS — J302 Other seasonal allergic rhinitis: Secondary | ICD-10-CM

## 2015-12-01 DIAGNOSIS — Z0282 Encounter for adoption services: Secondary | ICD-10-CM

## 2015-12-01 DIAGNOSIS — E039 Hypothyroidism, unspecified: Secondary | ICD-10-CM | POA: Diagnosis not present

## 2015-12-01 DIAGNOSIS — Z7681 Expectant parent(s) prebirth pediatrician visit: Secondary | ICD-10-CM

## 2015-12-01 LAB — POCT URINALYSIS DIPSTICK
BILIRUBIN UA: NEGATIVE
Blood, UA: NEGATIVE
GLUCOSE UA: NEGATIVE
Ketones, UA: NEGATIVE
LEUKOCYTES UA: NEGATIVE
NITRITE UA: NEGATIVE
Protein, UA: NEGATIVE
Spec Grav, UA: >=1.03
UROBILINOGEN UA: 0.2
pH, UA: 6

## 2015-12-01 LAB — TB SKIN TEST
Induration: 2.5 mm
TB SKIN TEST: NEGATIVE

## 2015-12-01 NOTE — Progress Notes (Addendum)
HPI:  Here for CPE:  -Concerns and/or follow up today:  Requires form completion for adoption.only meds are levothyroxine and flonase. Stable.  Had tb skin test a few days ago, interpreted yesterday when I was out of office. There was a miscommunication/misunderstanding with staff and doc and pt was told she had a positive test, when in fact it was negative. CXR was done and was negative. She is very anxious about this. I apologized and explained. Advised I'll talk with Engineer, building services. Plan on using this as learning opportunity for staff and had discussed with rn team lead to arrange class for all nurse and CMA staff to refresh steps for giving and reading TB skin tests.  -Diet: variety of foods, balance and well rounded, larger portion sizes  -Exercise: no regular exercise  -Taking folic acid, vitamin D or calcium: no  -Diabetes and Dyslipidemia Screening: plans to do labs  -Hx of HTN: no  -Vaccines: UTD  -pap history: sees gyn  -sexual activity: yes, female partner, no new partners  -wants STI testing (Hep C if born [birth year]): no  -FH breast, colon or ovarian ca: see FH Last mammogram: sees gyn Last colon cancer screening: n/a  Breast Ca Risk Assessment: -sees gyn  -Alcohol, Tobacco, drug use: see social history  Review of Systems - no fevers, unintentional weight loss, vision loss, hearing loss, chest pain, sob, hemoptysis, melena, hematochezia, hematuria, genital discharge, changing or concerning skin lesions, bleeding, bruising, loc, thoughts of self harm or SI  Past Medical History:  Diagnosis Date  . Blood in stool   . Dyslipidemia 11/18/2014  . Hypothyroidism   . Kidney stones   . Morbid obesity ([place]) 11/18/2014  . Seasonal allergies 11/22/2015  . Urinary tract infection     Past Surgical History:  Procedure Laterality Date  . DILATION AND CURETTAGE OF UTERUS    . UTERINE FIBROID EMBOLIZATION      Family History  Problem Relation Age of Onset  .  Diabetes Mother   . Stroke Mother   . Hypertension Mother   . Diabetes Father   . Diabetes Brother     Social History   Social History  . Marital status: Married    Spouse name: N/A  . Number of children: N/A  . Years of education: N/A   Social History Main Topics  . Smoking status: Never Smoker  . Smokeless tobacco: None  . Alcohol use Yes     Comment: 1 drink rarely  . Drug use: No  . Sexual activity: Yes    Birth control/ protection: Other-see comments     Comment: tubal   Other Topics Concern  . 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  fluticasone (FLONASE) 50 MCG/ACT nasal spray, Place into both nostrils daily., Disp: , Rfl:  .  levothyroxine (SYNTHROID, LEVOTHROID) 100 MCG tablet, Take 1 tablet by mouth  daily before breakfast, Disp: 90 tablet, Rfl: 1  EXAM:  Vitals:   12/01/15 1503  BP: 118/78  Pulse: 66  Temp: 98.5 F (36.9 C)   Body mass index is 40 kg/m.  GENERAL: vitals reviewed and listed below, alert, oriented, appears well hydrated and  in no acute distress  HEENT: head atraumatic, PERRLA, normal appearance of eyes, ears, nose and mouth. moist mucus membranes.  NECK: supple, no masses or lymphadenopathy  LUNGS: clear to auscultation bilaterally, no rales, rhonchi or wheeze  CV: HRRR, no peripheral edema or cyanosis, normal pedal pulses  BREAST:declined, sees gyn  ABDOMEN: bowel sounds normal, soft, non tender to palpation, no masses, no rebound or guarding  GU: declined, sees gyn  SKIN: no rash or abnormal lesions  MS: normal gait, moves all extremities normally  NEURO: normal gait, speech and thought processing grossly intact, muscle tone grossly intact throughout  PSYCH: normal affect,  pleasant and cooperative  ASSESSMENT AND PLAN:  Discussed the following assessment and plan:  Annual physical exam - Plan: POC Urinalysis Dipstick, HIV antibody (with reflex), Lipid panel, Hemoglobin A1c, CBC (no diff)  Hypothyroidism, unspecified type - Plan: TSH  Chronic seasonal allergic rhinitis, unspecified trigger  Dyslipidemia  Pre-adoption visit for adoptive parent - Plan: Quantiferon tb gold assay  Encounter for immunization - Plan: Flu Vaccine QUAD 36+ mos IM  -quant gold for her anxiety after false interpretation tb skin test, discussed with rn team lead to have staff training/refresher and to practice administrator to remove cxr charge. Pt very understanding and grateful was a false interpretation rather then a positive test.  -Discussed and advised all [place] preventive services health task force level A and B recommendations for age, sex and risks.  -Advised at least 150 minutes of exercise per week and a healthy diet with avoidance of (less then 1 serving per week) processed foods, white starches, red meat, fast foods and sweets and consisting of: * 5-9 servings of fresh fruits and vegetables (not corn or potatoes) *nuts and seeds, beans *olives and olive oil *lean meats such as fish and white chicken  *whole grains  -labs, studies and vaccines per orders this encounter  Orders Placed This Encounter  Procedures  . Flu Vaccine QUAD 36+ mos IM  . HIV antibody (with reflex)  . TSH  . Lipid panel  . Hemoglobin A1c  . CBC (no diff)  . Quantiferon tb gold assay  . POC Urinalysis Dipstick    Patient advised to return to clinic immediately if symptoms worsen or persist or new concerns.  Patient Instructions  BEFORE YOU LEAVE: -flu shot -labs -follow up: 6 months  Complete your mammogram and gynecology annual exams.  Wendie Simmer will plan to complete the letter and form for you once the lab results return. Please call our office if you have not heard from her in the  next 2 weeks.  We have ordered labs or studies at this visit. It can take up to 1-2 weeks for results and processing. IF results require follow up or explanation, we will call you with instructions. Clinically stable results will be released to your [facility]. If you have not heard from [place] or cannot find your results in [facility] in 2 weeks please contact our office at [phone].   If you are not yet signed up for [facility], please SIGN UP TODAY. We now offer online scheduling, same day appointments and extended hours. WHEN YOU DON'T FEEL YOUR BEST.Marland KitchenMarland KitchenWE ARE HERE TO HELP.   We recommend the following healthy lifestyle for LIFE: 1) Small portions.   Tip: eat off of a salad plate instead of a dinner plate.  Tip: It is ok to feel hungry after a meal - that likely means you ate an appropriate portion.  Tip: if you need  more or a snack choose fruits, veggies and/or a handful of nuts or seeds.  2) Eat a healthy clean diet.  * Tip: Avoid (less then 1 serving per week): processed foods, sweets, sweetened drinks, white starches (rice, flour, bread, potatoes, pasta, etc), red meat, fast foods, butter  *Tip: CHOOSE instead   * 5-9 servings per day of fresh or frozen fruits and vegetables (but not corn, potatoes, bananas, canned or dried fruit)   *nuts and seeds, beans   *olives and olive oil   *small portions of lean meats such as fish and white chicken    *small portions of whole grains  3)Get at least 150 minutes of sweaty aerobic exercise per week.  4)Reduce stress - consider counseling, meditation and relaxation to balance other aspects of your life.            No Follow-up on file.  Colin Benton R., DO

## 2015-12-01 NOTE — Progress Notes (Signed)
Pre visit review using our clinic review tool, if applicable. No additional management support is needed unless otherwise documented below in the visit note. 

## 2015-12-01 NOTE — Progress Notes (Signed)
Discussed with Sheena whom confirm < 5 mm induration. Discussed with Dr. Regis Bill whom did not see pt - she thought nursing staff had told her it was 2 cm in diameter, not 84mm and thus advised xray. Engineer, building services and RN team lead notified. Pt notified. Pt understanding and gratetful not positive. Result updated. Training for nursing and cma staff planned - RN team lead to organize.

## 2015-12-01 NOTE — Patient Instructions (Signed)
BEFORE YOU LEAVE: -flu shot -labs -follow up: 6 months  Complete your mammogram and gynecology annual exams.  Wendie Simmer will plan to complete the letter and form for you once the lab results return. Please call our office if you have not heard from her in the next 2 weeks.  We have ordered labs or studies at this visit. It can take up to 1-2 weeks for results and processing. IF results require follow up or explanation, we will call you with instructions. Clinically stable results will be released to your [facility]. If you have not heard from [place] or cannot find your results in [facility] in 2 weeks please contact our office at [phone].   If you are not yet signed up for [facility], please SIGN UP TODAY. We now offer online scheduling, same day appointments and extended hours. WHEN YOU DON'T FEEL YOUR BEST.Marland KitchenMarland KitchenWE ARE HERE TO HELP.   We recommend the following healthy lifestyle for LIFE: 1) Small portions.   Tip: eat off of a salad plate instead of a dinner plate.  Tip: It is ok to feel hungry after a meal - that likely means you ate an appropriate portion.  Tip: if you need more or a snack choose fruits, veggies and/or a handful of nuts or seeds.  2) Eat a healthy clean diet.  * Tip: Avoid (less then 1 serving per week): processed foods, sweets, sweetened drinks, white starches (rice, flour, bread, potatoes, pasta, etc), red meat, fast foods, butter  *Tip: CHOOSE instead   * 5-9 servings per day of fresh or frozen fruits and vegetables (but not corn, potatoes, bananas, canned or dried fruit)   *nuts and seeds, beans   *olives and olive oil   *small portions of lean meats such as fish and white chicken    *small portions of whole grains  3)Get at least 150 minutes of sweaty aerobic exercise per week.  4)Reduce stress - consider counseling, meditation and relaxation to balance other aspects of your life.

## 2015-12-02 LAB — LIPID PANEL
CHOL/HDL RATIO: 4
Cholesterol: 161 mg/dL (ref 0–200)
HDL: 39.8 mg/dL (ref >39.00)
LDL Cholesterol: 99 mg/dL (ref 0–99)
NONHDL: 121.11
Triglycerides: 110 mg/dL (ref 0.0–149.0)
VLDL: 22 mg/dL (ref 0.0–40.0)

## 2015-12-02 LAB — CBC
HCT: 40.6 % (ref 36.0–46.0)
Hemoglobin: 13.7 g/dL (ref 12.0–15.0)
MCHC: 33.7 g/dL (ref 30.0–36.0)
MCV: 87.2 fl (ref 78.0–100.0)
PLATELETS: 353 10*3/uL (ref 150.0–400.0)
RBC: 4.66 Mil/uL (ref 3.87–5.11)
RDW: 13.4 % (ref 11.5–15.5)
WBC: 7.4 10*3/uL (ref 4.0–10.5)

## 2015-12-02 LAB — HIV ANTIBODY (ROUTINE TESTING W REFLEX): HIV: NONREACTIVE

## 2015-12-02 LAB — TSH: TSH: 2.43 u[IU]/mL (ref 0.35–4.50)

## 2015-12-02 LAB — HEMOGLOBIN A1C: HEMOGLOBIN A1C: 5.3 % (ref 4.6–6.5)

## 2015-12-05 LAB — QUANTIFERON TB GOLD ASSAY (BLOOD)
INTERFERON GAMMA RELEASE ASSAY: POSITIVE — AB
Mitogen-Nil: 7.79 IU/mL
QUANTIFERON NIL VALUE: 0.04 [IU]/mL
Quantiferon Tb Ag Minus Nil Value: 3.83 IU/mL

## 2015-12-06 ENCOUNTER — Telehealth: Payer: Self-pay | Admitting: Family Medicine

## 2015-12-06 NOTE — Telephone Encounter (Signed)
° °  Pt said she on the phone with Dr Maudie Mercury and had another question about her test and would like a call back

## 2015-12-29 ENCOUNTER — Ambulatory Visit: Payer: Commercial Managed Care - HMO

## 2016-01-17 DIAGNOSIS — J018 Other acute sinusitis: Secondary | ICD-10-CM | POA: Diagnosis not present

## 2016-01-20 ENCOUNTER — Ambulatory Visit: Payer: Commercial Managed Care - HMO

## 2016-01-31 DIAGNOSIS — J00 Acute nasopharyngitis [common cold]: Secondary | ICD-10-CM | POA: Diagnosis not present

## 2016-01-31 DIAGNOSIS — J029 Acute pharyngitis, unspecified: Secondary | ICD-10-CM | POA: Diagnosis not present

## 2016-02-06 ENCOUNTER — Ambulatory Visit (INDEPENDENT_AMBULATORY_CARE_PROVIDER_SITE_OTHER): Payer: Commercial Managed Care - HMO | Admitting: Family Medicine

## 2016-02-06 VITALS — BP 118/80 | HR 83 | Temp 97.9°F | Ht 61.0 in | Wt 206.0 lb

## 2016-02-06 DIAGNOSIS — R7612 Nonspecific reaction to cell mediated immunity measurement of gamma interferon antigen response without active tuberculosis: Secondary | ICD-10-CM

## 2016-02-06 DIAGNOSIS — J111 Influenza due to unidentified influenza virus with other respiratory manifestations: Secondary | ICD-10-CM | POA: Diagnosis not present

## 2016-02-06 DIAGNOSIS — J0101 Acute recurrent maxillary sinusitis: Secondary | ICD-10-CM | POA: Diagnosis not present

## 2016-02-06 HISTORY — DX: Nonspecific reaction to cell mediated immunity measurement of gamma interferon antigen response without active tuberculosis: R76.12

## 2016-02-06 MED ORDER — ERYTHROMYCIN 5 MG/GM OP OINT: 1.0000 "application " | TOPICAL_OINTMENT | Freq: Every day | OPHTHALMIC | 0 refills | Status: DC

## 2016-02-06 MED ORDER — DOXYCYCLINE HYCLATE 100 MG PO CAPS: 100.0000 mg | ORAL_CAPSULE | Freq: Two times a day (BID) | ORAL | 0 refills | Status: DC

## 2016-02-06 NOTE — Progress Notes (Signed)
HPI:  Infleunza and pink eye: -started: 7 days ago, treated with tamiflu 1 day after symptoms started at [facility] -symptoms:nasal congestion, sore throat, cough, fevers and body aches initially - now improving, but now with thick green sinus congestion and maxillary sinus pain/tooth pain and discolored drainage from irritated L eye. -denies:persistent or recurrent fever, SOB, NVD, vission changes or eye trauma  ROS: See pertinent positives and negatives per HPI.  Past Medical History:  Diagnosis Date  . Blood in stool   . Dyslipidemia 11/18/2014  . Hypothyroidism   . Kidney stones   . Morbid obesity ([place]) 11/18/2014  . Seasonal allergies 11/22/2015  . Urinary tract infection     Past Surgical History:  Procedure Laterality Date  . DILATION AND CURETTAGE OF UTERUS    . UTERINE FIBROID EMBOLIZATION      Family History  Problem Relation Age of Onset  . Diabetes Mother   . Stroke Mother   . Hypertension Mother   . Diabetes Father   . Diabetes Brother     Social History   Social History  . Marital status: Married    Spouse name: N/A  . Number of children: N/A  . Years of education: N/A   Social History Main Topics  . Smoking status: Never Smoker  . Smokeless tobacco: Not on file  . Alcohol use Yes     Comment: 1 drink rarely  . Drug use: No  . Sexual activity: Yes    Birth control/ protection: Other-see comments     Comment: tubal   Other Topics Concern  . 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  fluticasone (FLONASE) 50 MCG/ACT nasal spray, Place into both nostrils daily., Disp: , Rfl:  .  levothyroxine (SYNTHROID, LEVOTHROID) 100 MCG tablet, Take 1 tablet by mouth  daily before breakfast, Disp: 90 tablet, Rfl: 1 .   doxycycline (VIBRAMYCIN) 100 MG capsule, Take 1 capsule (100 mg total) by mouth 2 (two) times daily., Disp: 20 capsule, Rfl: 0 .  erythromycin ophthalmic ointment, Place 1 application into the left eye at bedtime., Disp: 3.5 g, Rfl: 0  EXAM:  Vitals:   02/06/16 0905  BP: 118/80  Pulse: 83  Temp: 97.9 F (36.6 C)    Body mass index is 38.92 kg/m.  GENERAL: vitals reviewed and listed above, alert, oriented, appears well hydrated and in no acute distress  HEENT: atraumatic, conjunttiva clear, no obvious abnormalities on inspection of external nose and ears, normal appearance of ear canals and TMs, clear nasal congestion, mild post oropharyngeal erythema with PND, no tonsillar edema or exudate, no sinus TTP  NECK: no obvious masses on inspection  LUNGS: clear to auscultation bilaterally, no wheezes, rales or rhonchi, good air movement  CV: HRRR, no peripheral edema  MS: moves all extremities without noticeable abnormality  PSYCH: pleasant and cooperative, no obvious depression or anxiety  ASSESSMENT AND PLAN:  Discussed the following assessment and plan:  Acute recurrent maxillary sinusitis  Influenza  -flu - now with sinusitis and conjunctivitis -opted to tx with abx for sinusitis and oint for eye -risks/return precautions discusssed -of course, we advised to return or notify  a doctor immediately if symptoms worsen or persist or new concerns arise.    Patient Instructions  Please take the antibiotic, doxycycline as instructed. Do not get in the sun while taking this. You can not take this medication if you are pregnant.  Use the ointment in the eye before bed daily for 5-7 days.  Seek care if worsening, new concerns or not improving with treatment.  I hope you feel better soon!   Colin Benton R., DO

## 2016-02-06 NOTE — Progress Notes (Signed)
Pre visit review using our clinic review tool, if applicable. No additional management support is needed unless otherwise documented below in the visit note. 

## 2016-02-06 NOTE — Patient Instructions (Signed)
Please take the antibiotic, doxycycline as instructed. Do not get in the sun while taking this. You can not take this medication if you are pregnant.  Use the ointment in the eye before bed daily for 5-7 days.  Seek care if worsening, new concerns or not improving with treatment.  I hope you feel better soon!

## 2016-02-08 ENCOUNTER — Ambulatory Visit: Payer: Commercial Managed Care - HMO

## 2016-02-29 ENCOUNTER — Ambulatory Visit: Payer: Commercial Managed Care - HMO

## 2016-03-27 ENCOUNTER — Telehealth: Payer: Self-pay | Admitting: Family Medicine

## 2016-03-27 NOTE — Telephone Encounter (Signed)
If needs paperwork completed with me would be appt with me - end of the day would be ok - but whatever works well for coordinating with Dr. Yong Channel if doing the same day? Is it a form or just something to sign?

## 2016-03-27 NOTE — Telephone Encounter (Signed)
Pt would like to see if you could fill out the adoption paperwork and they will be bringing in a notary so that the providers signatures can be witnessed.  When would you be available for this to happen morning - after lunch -or last pt of the day pt state that it should be a nurses visit is that correct?

## 2016-03-27 NOTE — Telephone Encounter (Signed)
Pt state that it is adoption forms.

## 2016-03-27 NOTE — Telephone Encounter (Signed)
Appt scheduled for 3/20 at 1:45pm and the pt stated the notary will come to the appt.  Husband to see Dr Yong Channel at 1pm.

## 2016-04-03 ENCOUNTER — Ambulatory Visit (INDEPENDENT_AMBULATORY_CARE_PROVIDER_SITE_OTHER): Payer: Commercial Managed Care - HMO | Admitting: Family Medicine

## 2016-04-03 ENCOUNTER — Encounter: Payer: Self-pay | Admitting: Family Medicine

## 2016-04-03 VITALS — BP 102/72 | HR 68 | Temp 98.4°F | Ht 61.0 in | Wt 209.7 lb

## 2016-04-03 DIAGNOSIS — E039 Hypothyroidism, unspecified: Secondary | ICD-10-CM | POA: Diagnosis not present

## 2016-04-03 MED ORDER — LEVOTHYROXINE SODIUM 100 MCG PO TABS: ORAL_TABLET | ORAL | 1 refills | Status: DC

## 2016-04-03 NOTE — Progress Notes (Signed)
Pre visit review using our clinic review tool, if applicable. No additional management support is needed unless otherwise documented below in the visit note. 

## 2016-04-03 NOTE — Progress Notes (Signed)
  HPI:  Kelli Ferrell is a pleasant 46 yo with a PMH significant for hypothyroidism, obesity and seasonal allergies here for follow up. Need signature on forms for adoption. Very excited about adoption. Needs refills on thyroid med prior to next visit. No concerns or complaints. Has follow up in May. ROS: See pertinent positives and negatives per HPI.  Past Medical History:  Diagnosis Date  . Blood in stool   . Dyslipidemia 11/18/2014  . Hypothyroidism   . Kidney stones   . Morbid obesity ([place]) 11/18/2014  . Seasonal allergies 11/22/2015  . Urinary tract infection     Past Surgical History:  Procedure Laterality Date  . DILATION AND CURETTAGE OF UTERUS    . UTERINE FIBROID EMBOLIZATION      Family History  Problem Relation Age of Onset  . Diabetes Mother   . Stroke Mother   . Hypertension Mother   . Diabetes Father   . Diabetes Brother     Social History   Social History  . Marital status: Married    Spouse name: N/A  . Number of children: N/A  . Years of education: N/A   Social History Main Topics  . Smoking status: Never Smoker  . Smokeless tobacco: Never Used  . Alcohol use Yes     Comment: 1 drink rarely  . Drug use: No  . Sexual activity: Yes    Birth control/ protection: Other-see comments     Comment: tubal   Other Topics Concern  . 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  fluticasone (FLONASE) 50 MCG/ACT nasal spray, Place into both nostrils daily., Disp: , Rfl:  .  levothyroxine (SYNTHROID, LEVOTHROID) 100 MCG tablet, Take 1 tablet by mouth  daily before breakfast, Disp: 90 tablet, Rfl: 1  EXAM:  Vitals:   04/03/16 1347  BP: 102/72  Pulse: 68  Temp: 98.4 F (36.9 C)    Body mass index is 39.62  kg/m.  GENERAL: vitals reviewed and listed above, alert, oriented, appears well hydrated and in no acute distress  HEENT: atraumatic, conjunttiva clear, no obvious abnormalities on inspection of external nose and ears  NECK: no obvious masses on inspection  MS: moves all extremities without noticeable abnormality  PSYCH: pleasant and cooperative, no obvious depression or anxiety  ASSESSMENT AND PLAN:  Discussed the following assessment and plan:  Hypothyroidism, unspecified type  -forms completed -will ensure has refills -follow up as planned -declined AVS -Patient advised to return or notify a doctor immediately if symptoms worsen or persist or new concerns arise.  There are no Patient Instructions on file for this visit.  Colin Benton R., DO

## 2016-05-06 ENCOUNTER — Other Ambulatory Visit: Payer: Self-pay | Admitting: Family Medicine

## 2016-05-10 ENCOUNTER — Ambulatory Visit
Admission: RE | Admit: 2016-05-10 | Discharge: 2016-05-10 | Disposition: A | Discharge status: Home / Self Care | Payer: Commercial Managed Care - HMO | Source: Ambulatory Visit | Attending: Family Medicine | Admitting: Family Medicine

## 2016-05-10 DIAGNOSIS — Z1231 Encounter for screening mammogram for malignant neoplasm of breast: Secondary | ICD-10-CM

## 2016-05-31 ENCOUNTER — Ambulatory Visit: Payer: Commercial Managed Care - HMO | Admitting: Family Medicine

## 2016-06-07 ENCOUNTER — Encounter: Payer: Self-pay | Admitting: Gastroenterology

## 2016-06-07 ENCOUNTER — Ambulatory Visit (INDEPENDENT_AMBULATORY_CARE_PROVIDER_SITE_OTHER): Payer: Commercial Managed Care - HMO | Admitting: Family Medicine

## 2016-06-07 ENCOUNTER — Encounter: Payer: Self-pay | Admitting: Family Medicine

## 2016-06-07 VITALS — BP 100/80 | HR 75 | Temp 98.8°F | Ht 61.0 in | Wt 213.2 lb

## 2016-06-07 DIAGNOSIS — R1310 Dysphagia, unspecified: Secondary | ICD-10-CM

## 2016-06-07 DIAGNOSIS — E039 Hypothyroidism, unspecified: Secondary | ICD-10-CM

## 2016-06-07 DIAGNOSIS — K625 Hemorrhage of anus and rectum: Secondary | ICD-10-CM

## 2016-06-07 DIAGNOSIS — E785 Hyperlipidemia, unspecified: Secondary | ICD-10-CM

## 2016-06-07 LAB — CBC
HCT: 37.9 % (ref 36.0–46.0)
Hemoglobin: 12.6 g/dL (ref 12.0–15.0)
MCHC: 33.2 g/dL (ref 30.0–36.0)
MCV: 87.7 fl (ref 78.0–100.0)
Platelets: 352 K/uL (ref 150.0–400.0)
RBC: 4.32 Mil/uL (ref 3.87–5.11)
RDW: 13.4 % (ref 11.5–15.5)
WBC: 6.8 K/uL (ref 4.0–10.5)

## 2016-06-07 LAB — TSH: TSH: 1.93 u[IU]/mL (ref 0.35–4.50)

## 2016-06-07 NOTE — Progress Notes (Signed)
HPI:  Kelli Ferrell is a pleasant 46 y.o. here for follow up. Chronic medical problems summarized below were reviewed for changes and stability and were updated as needed below. These issues and their treatment remain stable for the most part. She reports getting daily exercise 30-60 minutes per day and a healthy diet.She has two new concerns. Dysphagia for a few months, usually to solids, occ feels like food gets stuck in mid esophagus and then has pain with swallowing and difficulty swallowing for a day. She has occ BRBPR with BM - last episode 4 weeks ago. Hx of hemorrhoids. Would like to see GI for evaluation. Denies CP, SOB, DOE, weight loss, change in bowel, reflux, treatment intolerance or new symptoms. Due for TSH  Hypothyroidism: -meds: levothyroxine  Obesity: -diet and exercise: see above  Seasonal Allergies: -meds: flonase  ROS: See pertinent positives and negatives per HPI.  Past Medical History:  Diagnosis Date  . Blood in stool   . Dyslipidemia 11/18/2014  . Hypothyroidism   . Kidney stones   . Morbid obesity ([place]) 11/18/2014  . 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 Seasonal allergies 11/22/2015  . Urinary tract infection     Past Surgical History:  Procedure Laterality Date  . DILATION AND CURETTAGE OF UTERUS    . UTERINE FIBROID EMBOLIZATION      Family History  Problem Relation Age of Onset  . Diabetes Mother   . Stroke Mother   . Hypertension Mother   . Diabetes Father   . Diabetes Brother     Social History   Social History  . Marital status: Married    Spouse name: N/A  . Number of children: N/A  . Years of education: N/A   Social History Main Topics  . Smoking status: Never Smoker  . Smokeless tobacco: Never Used  . Alcohol use Yes     Comment: 1 drink  rarely  . Drug use: No  . Sexual activity: Yes    Birth control/ protection: Other-see comments     Comment: tubal   Other Topics Concern  . 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  fluticasone (FLONASE) 50 MCG/ACT nasal spray, Place into both nostrils daily., Disp: , Rfl:  .  levothyroxine (SYNTHROID, LEVOTHROID) 100 MCG tablet, TAKE 1 TABLET BY MOUTH  DAILY BEFORE BREAKFAST, Disp: 90 tablet, Rfl: 1  EXAM:  Vitals:   06/07/16 1348  BP: 100/80  Pulse: 75  Temp: 98.8 F (37.1 C)    Body mass index is 40.28 kg/m.  GENERAL: vitals reviewed and listed above, alert, oriented, appears well hydrated and in no acute distress  HEENT: atraumatic, conjunttiva clear, no obvious abnormalities on inspection of external nose and ears  NECK: no obvious masses on inspection  LUNGS: clear to auscultation bilaterally, no wheezes, rales or rhonchi, good air movement  CV: HRRR, no peripheral edema  MS: moves all extremities without noticeable abnormality  PSYCH: pleasant and cooperative, no obvious depression or  anxiety  ASSESSMENT AND PLAN:  Discussed the following assessment and plan:  Dysphagia, unspecified type - Plan: Ambulatory referral to Gastroenterology  BRBPR (bright red blood per rectum) - Plan: CBC, Ambulatory referral to Gastroenterology  Hypothyroidism, unspecified type - Plan: TSH  Morbid obesity ([place])  Dyslipidemia  -labs per orders -referral to GI for dysphagia and hx BRBPR and hemorrhoids -nexium in interim once daily -lifestyle recs -follow up here in 3-4 months -Patient advised to return or notify a doctor immediately if symptoms worsen or persist or new concerns arise.  Patient Instructions  BEFORE YOU  LEAVE: -follow up: 3-4 months -labs  Start nexium over the counter dose once daily until you see the gastroenterologist. Sarina Ser food well. Small bites and plenty of fluids.  -We placed a referral for you as discussed to the gastroenterologist. It usually takes about 1-2 weeks to process and schedule this referral. If you have not heard from [place] regarding this appointment in 2 weeks please contact our office.  We have ordered labs or studies at this visit. It can take up to 1-2 weeks for results and processing. IF results require follow up or explanation, we will call you with instructions. Clinically stable results will be released to your [facility]. If you have not heard from [place] or cannot find your results in [facility] in 2 weeks please contact our office at [phone].  If you are not yet signed up for [facility], please consider signing up.  Advise regular aerobic exercise (at least 150 minutes per week of sweaty exercise) and a healthy diet. Try to eat at least 5-9 servings of vegetables and fruits per day (not corn, potatoes or bananas.) Avoid sweets, red meat, pork, butter, fried foods, fast food, processed food, excessive dairy, eggs and coconut. Replace bad fats with good fats - fish, nuts and seeds, canola oil, olive oil.            Colin Benton R., DO

## 2016-06-07 NOTE — Patient Instructions (Signed)
BEFORE YOU LEAVE: -follow up: 3-4 months -labs  Start nexium over the counter dose once daily until you see the gastroenterologist. Sarina Ser food well. Small bites and plenty of fluids.  -We placed a referral for you as discussed to the gastroenterologist. It usually takes about 1-2 weeks to process and schedule this referral. If you have not heard from [place] regarding this appointment in 2 weeks please contact our office.  We have ordered labs or studies at this visit. It can take up to 1-2 weeks for results and processing. IF results require follow up or explanation, we will call you with instructions. Clinically stable results will be released to your [facility]. If you have not heard from [place] or cannot find your results in [facility] in 2 weeks please contact our office at [phone].  If you are not yet signed up for [facility], please consider signing up.  Advise regular aerobic exercise (at least 150 minutes per week of sweaty exercise) and a healthy diet. Try to eat at least 5-9 servings of vegetables and fruits per day (not corn, potatoes or bananas.) Avoid sweets, red meat, pork, butter, fried foods, fast food, processed food, excessive dairy, eggs and coconut. Replace bad fats with good fats - fish, nuts and seeds, canola oil, olive oil.

## 2016-06-14 ENCOUNTER — Ambulatory Visit (INDEPENDENT_AMBULATORY_CARE_PROVIDER_SITE_OTHER): Payer: Commercial Managed Care - HMO | Admitting: Gastroenterology

## 2016-06-14 ENCOUNTER — Encounter: Payer: Self-pay | Admitting: Gastroenterology

## 2016-06-14 VITALS — BP 110/70 | HR 86 | Ht 61.0 in | Wt 213.8 lb

## 2016-06-14 DIAGNOSIS — R1310 Dysphagia, unspecified: Secondary | ICD-10-CM

## 2016-06-14 DIAGNOSIS — K625 Hemorrhage of anus and rectum: Secondary | ICD-10-CM | POA: Diagnosis not present

## 2016-06-14 MED ORDER — HYDROCORTISONE ACETATE 25 MG RE SUPP: 25.0000 mg | Freq: Every day | RECTAL | 2 refills | Status: DC

## 2016-06-14 MED ORDER — PANTOPRAZOLE SODIUM 40 MG PO TBEC: 40.0000 mg | DELAYED_RELEASE_TABLET | Freq: Every day | ORAL | 5 refills | Status: DC

## 2016-06-14 NOTE — Progress Notes (Signed)
06/14/2016 Kelli Ferrell [id number] 10-Jun-1970   HISTORY OF PRESENT ILLNESS:  This is a 46 year old female who is new to our practice. She was referred here by Dr. Colin Benton, for evaluation regarding issues with swallowing and rectal bleeding. The patient tells me that a couple of months ago she began having intermittent issues of discomfort in her chest when she swallows food or beverage. She says that it hurts and feels like it is stuck, however, it is not. She says that it will continue for about a day or so and then will resolve until happens again. They're sometimes weeks between these episodes. She says that she has had some heartburn/burning reflux sensation in the evening/at night. She was recently started on Nexium 20 mg daily over-the-counter by her PCP just last week. She has not had any episodes since beginning the medication.  She also reports rectal bleeding. She says that she knows that she's had hemorrhoids in the past. She has seen bright red blood in small amounts on the toilet paper on occasion with some associated burning and itching of the perianal area at times. She says that she moves her bowels regularly.   Recent CBC and TSH are normal.  Past Medical History:  Diagnosis Date  . Blood in stool   . Dyslipidemia 11/18/2014  . Hypothyroidism   . Kidney stones   . Morbid obesity ([place]) 11/18/2014  . 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 Seasonal allergies 11/22/2015  . Urinary tract infection    Past Surgical History:  Procedure Laterality Date  . DILATION AND CURETTAGE OF UTERUS    . UTERINE FIBROID EMBOLIZATION      reports that she has never smoked. She has never used smokeless tobacco. She reports that she drinks alcohol. She reports that she does not use drugs. family  history includes Diabetes in her brother, father, and mother; Hypertension in her mother; Stroke in her mother. No Known Allergies    Outpatient Encounter Prescriptions as of 06/14/2016  Medication Sig  . esomeprazole (NEXIUM) 20 MG capsule Take 20 mg by mouth daily at 12 noon. Every morning  . fluticasone (FLONASE) 50 MCG/ACT nasal spray Place into both nostrils daily.  Marland Kitchen levothyroxine (SYNTHROID, LEVOTHROID) 100 MCG tablet TAKE 1 TABLET BY MOUTH  DAILY BEFORE BREAKFAST   No facility-administered encounter medications on file as of 06/14/2016.      REVIEW OF SYSTEMS  : All other systems reviewed and negative except where noted in the History of Present Illness.   PHYSICAL EXAM: BP 110/70   Pulse 86   Ht 5\' 1"  (1.549 m)   Wt 213 lb 12.8 oz (97 kg)   SpO2 98%   BMI 40.40 kg/m  General: Well developed white female in no acute distress Head: Normocephalic and atraumatic Eyes:  Sclerae anicteric, conjunctiva pink. Ears: Normal auditory acuity Lungs: Clear throughout to auscultation; no increased WOB. Heart: Regular rate and rhythm Abdomen: Soft, non-distended. Normal bowel sounds.  Non-tender. Rectal:  No external abnormalities noted.  No masses noted on DRE.  Small amount of brown, heme negative stool was present.   Musculoskeletal: Symmetrical with no gross deformities  Skin: No lesions on visible extremities Extremities: No edema  Neurological: Alert oriented x 4, grossly non-focal Psychological:  Alert and cooperative. Normal mood  and affect  ASSESSMENT AND PLAN: -Odynophagia:  Intermittent.  ? Reflux related, esophagitis, esophageal spasm.  Does have some reflux symptoms and was recently started on Nexium 20 mg OTC.  I am going to increase PPI to 40 mg daily for a while to see if this helps her symptoms. -Rectal bleeding:  Likely outlet source.  Small amounts, bright red blood.  Will treat with hydrocortisone suppositories prn.  *I discussed and offered to schedule both EGD  and colonoscopy, however, she could not due that at this time due to potential conflicts with upcoming travel to finalize adoption of two children from [place].   CC:  Lucretia Kern, DO

## 2016-06-14 NOTE — Patient Instructions (Signed)
We have sent the following medications to your pharmacy for you to pick up at your convenience:  Pantoprazole 40 mg daily  Hydrocortisone suppositories at bedtime x 10 days.

## 2016-06-15 NOTE — Progress Notes (Signed)
Reviewed and agree with initial management plan. Colonoscopy is recommended if she has any more rectal bleeding.  EGD recommended if UGI symptoms do not resolve on PPI daily.   Pricilla Riffle. Fuller Plan, MD [facility]

## 2016-09-03 ENCOUNTER — Other Ambulatory Visit: Payer: Self-pay | Admitting: *Deleted

## 2016-09-03 MED ORDER — LEVOTHYROXINE SODIUM 100 MCG PO TABS: ORAL_TABLET | ORAL | 1 refills | Status: DC

## 2016-09-03 NOTE — Telephone Encounter (Signed)
Rx done. 

## 2016-10-04 ENCOUNTER — Encounter: Payer: Self-pay | Admitting: Family Medicine

## 2016-10-05 ENCOUNTER — Ambulatory Visit: Payer: Commercial Managed Care - HMO | Admitting: Family Medicine

## 2016-12-13 ENCOUNTER — Other Ambulatory Visit: Payer: Self-pay | Admitting: Gastroenterology

## 2016-12-18 ENCOUNTER — Telehealth: Payer: Self-pay | Admitting: Internal Medicine

## 2016-12-18 NOTE — Telephone Encounter (Signed)
Tammy, I assume the kids are young. It would be best if Ms Fazzino takes her adopted children to see Dr Harvest Dark who is a [place] speaking pediatrician in town. Thx

## 2016-12-18 NOTE — Telephone Encounter (Signed)
Copied from [place] [phone]. Topic: General - Other >> Dec 17, 2016  3:47 PM Yvette Rack wrote: Reason for CRM: patient is not a patient at [facility] but she had requested for Dr Alain Marion to see her two children it was denied but she also would like for Dr Alain Marion to refer her to a provider that speak [place] I ask patient if she had looked up any on the web and she said they only named [place] she said that the two children are adopted and that all their medical paperwork is in [place] if you could please give Mrs Ruedas a call back at [phone]

## 2016-12-20 NOTE — Telephone Encounter (Signed)
Informed patient

## 2017-01-11 DIAGNOSIS — J029 Acute pharyngitis, unspecified: Secondary | ICD-10-CM | POA: Diagnosis not present

## 2017-01-11 DIAGNOSIS — J069 Acute upper respiratory infection, unspecified: Secondary | ICD-10-CM | POA: Diagnosis not present

## 2017-01-17 ENCOUNTER — Encounter: Payer: Self-pay | Admitting: Family Medicine

## 2017-01-17 ENCOUNTER — Ambulatory Visit: Payer: 59 | Admitting: Family Medicine

## 2017-01-17 VITALS — BP 118/80 | HR 84 | Temp 98.6°F | Ht 61.0 in | Wt 209.6 lb

## 2017-01-17 DIAGNOSIS — J329 Chronic sinusitis, unspecified: Secondary | ICD-10-CM

## 2017-01-17 DIAGNOSIS — J310 Chronic rhinitis: Secondary | ICD-10-CM

## 2017-01-17 MED ORDER — AMOXICILLIN-POT CLAVULANATE 875-125 MG PO TABS: 1.0000 | ORAL_TABLET | Freq: Two times a day (BID) | ORAL | 0 refills | Status: DC

## 2017-01-17 NOTE — Progress Notes (Signed)
HPI:  Acute visit for respiratory illness: -started:10 days ago -symptoms:nasal congestion, sore throat, cough, PND, sinus pressure and pain -denies:fever, SOB, NVD, tooth pain -has tried: musinex, afrin, saline -sick contacts/travel/risks: no reported flu, strep or tick exposure -Hx of: sinusitis  ROS: See pertinent positives and negatives per HPI.  Past Medical History:  Diagnosis Date  . Blood in stool   . Dyslipidemia 11/18/2014  . Hypothyroidism   . Kidney stones   . Morbid obesity ([place]) 11/18/2014  . 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 Seasonal allergies 11/22/2015  . Urinary tract infection     Past Surgical History:  Procedure Laterality Date  . DILATION AND CURETTAGE OF UTERUS    . UTERINE FIBROID EMBOLIZATION      Family History  Problem Relation Age of Onset  . Diabetes Mother   . Stroke Mother   . Hypertension Mother   . Diabetes Father   . Diabetes Brother     Social History   Socioeconomic History  . Marital status: Married    Spouse name: None  . Number of children: None  . Years of education: None  . Highest education level: None  Social Needs  . Financial resource strain: None  . Food insecurity - worry: None  . Food insecurity - inability: None  . Transportation needs - medical: None  . Transportation needs - non-medical: None  Occupational History  . None  Tobacco Use  . Smoking status: Never Smoker  . Smokeless tobacco: Never Used  Substance and Sexual Activity  . Alcohol use: Yes    Comment: 1 drink rarely  . Drug use: No  . Sexual activity: Yes    Birth control/protection: Other-see comments    Comment: tubal  Other Topics Concern  . None  Social History Narrative   Work or School: [employer] Situation: lives  with husband and two children      Spiritual Beliefs: Christian      Lifestyle: regular exercise and healthy diet              Current Outpatient Medications:  .  fluticasone (FLONASE) 50 MCG/ACT nasal spray, Place into both nostrils daily., Disp: , Rfl:  .  levothyroxine (SYNTHROID, LEVOTHROID) 100 MCG tablet, TAKE 1 TABLET BY MOUTH  DAILY BEFORE BREAKFAST, Disp: 90 tablet, Rfl: 1 .  pantoprazole (PROTONIX) 40 MG tablet, TAKE 1 TABLET BY MOUTH EVERY DAY, Disp: 30 tablet, Rfl: 5 .  amoxicillin-clavulanate (AUGMENTIN) 875-125 MG tablet, Take 1 tablet by mouth 2 (two) times daily., Disp: 20 tablet, Rfl: 0  EXAM:  Vitals:   01/17/17 1037  BP: 118/80  Pulse: 84  Temp: 98.6 F (37 C)  SpO2: 99%    Body mass index is 39.6 kg/m.  GENERAL: vitals reviewed and listed above, alert, oriented, appears well hydrated and in no acute distress  HEENT: atraumatic, conjunttiva clear, no obvious abnormalities on inspection of external nose and ears, normal appearance of ear canals and TMs, clear nasal congestion, mild post oropharyngeal erythema with PND, no tonsillar edema or exudate, no sinus TTP  NECK: no obvious masses on inspection  LUNGS: clear to auscultation bilaterally, no wheezes, rales or rhonchi, good air movement  CV: HRRR, no peripheral edema  MS: moves all  extremities without noticeable abnormality  PSYCH: pleasant and cooperative, no obvious depression or anxiety  ASSESSMENT AND PLAN:  Discussed the following assessment and plan:  Rhinosinusitis  -given HPI and exam findings today, a serious infection or illness is unlikely. We discussed potential etiologies, with VURI or possible developing bacterial sinusitis being most likely. We discussed treatment side effects, likely course, antibiotic misuse, transmission, and signs of developing a serious illness.  Opted for symptomatic care, nasal saline, stop the Afrin, thyroid night and antibiotic if worsening or not  improving over the next several days. -of course, we advised to return or notify a doctor immediately if symptoms worsen or persist or new concerns arise.    Patient Instructions  BEFORE YOU LEAVE: -? Flu shot -set up lab appt in 2 weeks -follow up: 4-6 months  Take the antibiotic if worsening or not improving over the next 3-4 days  Stop the afrin   INSTRUCTIONS FOR UPPER RESPIRATORY INFECTION:  -plenty of rest and fluids  -nasal saline wash 2-3 times daily (use prepackaged nasal saline or bottled/distilled water if making your own)   -can use tylenol (in no history of liver disease) or ibuprofen (if no history of kidney disease, bowel bleeding or significant heart disease) as directed for aches and sorethroat  -in the winter time, using a humidifier at night is helpful (please follow cleaning instructions)  -if you are taking a cough medication - use only as directed, may also try a teaspoon of honey to coat the throat and throat lozenges. If given a cough medication with codeine or hydrocodone or other narcotic please be advised that this contains a strong and  potentially addicting medication. Please follow instructions carefully, take as little as possible and only use AS NEEDED for severe cough. Discuss potential side effects with your pharmacy. Please do not drive or operate machinery while taking these types of medications. Please do not take other sedating medications, drugs or alcohol while taking this medication without discussing with your doctor.  -for sore throat, salt water gargles can help  -follow up if you have fevers, facial pain, tooth pain, difficulty breathing or are worsening or symptoms persist longer then expected  Upper Respiratory Infection, Adult An upper respiratory infection (URI) is also known as the common cold. It is often caused by a type of germ (virus). Colds are easily spread (contagious). You can pass it to others by kissing, coughing, sneezing,  or drinking out of the same glass. Usually, you get better in 1 to 3  weeks.  However, the cough can last for even longer. HOME CARE   Only take medicine as told by your doctor. Follow instructions provided above.  Drink enough water and fluids to keep your pee (urine) clear or pale yellow.  Get plenty of rest.  Return to work when your temperature is < 100 for 24 hours or as told by your doctor. You may use a face mask and wash your hands to stop your cold from spreading. GET HELP RIGHT AWAY IF:   After the first few days, you feel you are getting worse.  You have questions about your medicine.  You have chills, shortness of breath, or red spit (mucus).  You have pain in the face for more then 1-2 days, especially when you bend forward.  You have a fever, puffy (swollen) neck, pain when you swallow, or white spots in the back of your throat.  You have a bad headache, ear pain, sinus pain, or  chest pain.  You have a high-pitched whistling sound when you breathe in and out (wheezing).  You cough up blood.  You have sore muscles or a stiff neck. MAKE SURE YOU:   Understand these instructions.  Will watch your condition.  Will get help right away if you are not doing well or get worse. Document Released: 06/20/2007 Document Revised: 03/26/2011 Document Reviewed: 04/08/2013 [facility] Patient Information 2015 [place], [place]. This information is not intended to replace advice given to you by your health care provider. Make sure you discuss any questions you have with your health care provider.     Colin Benton R., DO

## 2017-01-17 NOTE — Patient Instructions (Addendum)
BEFORE YOU LEAVE: -? Flu shot -set up lab appt in 2 weeks -follow up: 4-6 months  Take the antibiotic if worsening or not improving over the next 3-4 days  Stop the afrin   INSTRUCTIONS FOR UPPER RESPIRATORY INFECTION:  -plenty of rest and fluids  -nasal saline wash 2-3 times daily (use prepackaged nasal saline or bottled/distilled water if making your own)   -can use tylenol (in no history of liver disease) or ibuprofen (if no history of kidney disease, bowel bleeding or significant heart disease) as directed for aches and sorethroat  -in the winter time, using a humidifier at night is helpful (please follow cleaning instructions)  -if you are taking a cough medication - use only as directed, may also try a teaspoon of honey to coat the throat and throat lozenges. If given a cough medication with codeine or hydrocodone or other narcotic please be advised that this contains a strong and  potentially addicting medication. Please follow instructions carefully, take as little as possible and only use AS NEEDED for severe cough. Discuss potential side effects with your pharmacy. Please do not drive or operate machinery while taking these types of medications. Please do not take other sedating medications, drugs or alcohol while taking this medication without discussing with your doctor.  -for sore throat, salt water gargles can help  -follow up if you have fevers, facial pain, tooth pain, difficulty breathing or are worsening or symptoms persist longer then expected  Upper Respiratory Infection, Adult An upper respiratory infection (URI) is also known as the common cold. It is often caused by a type of germ (virus). Colds are easily spread (contagious). You can pass it to others by kissing, coughing, sneezing, or drinking out of the same glass. Usually, you get better in 1 to 3  weeks.  However, the cough can last for even longer. HOME CARE   Only take medicine as told by your doctor.  Follow instructions provided above.  Drink enough water and fluids to keep your pee (urine) clear or pale yellow.  Get plenty of rest.  Return to work when your temperature is < 100 for 24 hours or as told by your doctor. You may use a face mask and wash your hands to stop your cold from spreading. GET HELP RIGHT AWAY IF:   After the first few days, you feel you are getting worse.  You have questions about your medicine.  You have chills, shortness of breath, or red spit (mucus).  You have pain in the face for more then 1-2 days, especially when you bend forward.  You have a fever, puffy (swollen) neck, pain when you swallow, or white spots in the back of your throat.  You have a bad headache, ear pain, sinus pain, or chest pain.  You have a high-pitched whistling sound when you breathe in and out (wheezing).  You cough up blood.  You have sore muscles or a stiff neck. MAKE SURE YOU:   Understand these instructions.  Will watch your condition.  Will get help right away if you are not doing well or get worse. Document Released: 06/20/2007 Document Revised: 03/26/2011 Document Reviewed: 04/08/2013 [facility] Patient Information 2015 [place], [place]. This information is not intended to replace advice given to you by your health care provider. Make sure you discuss any questions you have with your health care provider.

## 2017-01-24 ENCOUNTER — Encounter: Payer: Self-pay | Admitting: Family Medicine

## 2017-02-01 ENCOUNTER — Other Ambulatory Visit (INDEPENDENT_AMBULATORY_CARE_PROVIDER_SITE_OTHER): Payer: 59

## 2017-02-01 DIAGNOSIS — E039 Hypothyroidism, unspecified: Secondary | ICD-10-CM | POA: Diagnosis not present

## 2017-02-01 LAB — TSH: TSH: 1.37 u[IU]/mL (ref 0.35–4.50)

## 2017-02-26 DIAGNOSIS — Z01419 Encounter for gynecological examination (general) (routine) without abnormal findings: Secondary | ICD-10-CM | POA: Diagnosis not present

## 2017-02-26 DIAGNOSIS — Z6841 Body Mass Index (BMI) 40.0 and over, adult: Secondary | ICD-10-CM | POA: Diagnosis not present

## 2017-02-26 DIAGNOSIS — Z124 Encounter for screening for malignant neoplasm of cervix: Secondary | ICD-10-CM | POA: Diagnosis not present

## 2017-02-26 DIAGNOSIS — N72 Inflammatory disease of cervix uteri: Secondary | ICD-10-CM | POA: Diagnosis not present

## 2017-02-26 LAB — HM PAP SMEAR

## 2017-03-06 ENCOUNTER — Other Ambulatory Visit: Payer: Self-pay | Admitting: Family Medicine

## 2017-03-11 DIAGNOSIS — N938 Other specified abnormal uterine and vaginal bleeding: Secondary | ICD-10-CM | POA: Diagnosis not present

## 2017-03-11 DIAGNOSIS — R1031 Right lower quadrant pain: Secondary | ICD-10-CM | POA: Diagnosis not present

## 2017-05-13 DIAGNOSIS — N83201 Unspecified ovarian cyst, right side: Secondary | ICD-10-CM | POA: Diagnosis not present

## 2017-05-13 DIAGNOSIS — D251 Intramural leiomyoma of uterus: Secondary | ICD-10-CM | POA: Diagnosis not present

## 2017-05-13 DIAGNOSIS — N83202 Unspecified ovarian cyst, left side: Secondary | ICD-10-CM | POA: Diagnosis not present

## 2017-06-08 ENCOUNTER — Other Ambulatory Visit: Payer: Self-pay | Admitting: Gastroenterology

## 2017-06-18 ENCOUNTER — Other Ambulatory Visit: Payer: Self-pay

## 2017-06-18 ENCOUNTER — Emergency Department (HOSPITAL_COMMUNITY)
Admission: EM | Admit: 2017-06-18 | Discharge: 2017-06-19 | Disposition: A | Discharge status: Home / Self Care | Payer: 59 | Attending: Emergency Medicine | Admitting: Emergency Medicine

## 2017-06-18 ENCOUNTER — Ambulatory Visit: Payer: Self-pay

## 2017-06-18 DIAGNOSIS — R51 Headache: Secondary | ICD-10-CM | POA: Insufficient documentation

## 2017-06-18 DIAGNOSIS — I10 Essential (primary) hypertension: Secondary | ICD-10-CM | POA: Insufficient documentation

## 2017-06-18 DIAGNOSIS — R030 Elevated blood-pressure reading, without diagnosis of hypertension: Secondary | ICD-10-CM | POA: Diagnosis not present

## 2017-06-18 DIAGNOSIS — Z79899 Other long term (current) drug therapy: Secondary | ICD-10-CM | POA: Diagnosis not present

## 2017-06-18 DIAGNOSIS — R519 Headache, unspecified: Secondary | ICD-10-CM

## 2017-06-18 DIAGNOSIS — R079 Chest pain, unspecified: Secondary | ICD-10-CM | POA: Diagnosis not present

## 2017-06-18 LAB — CBC WITH DIFFERENTIAL/PLATELET
ABS IMMATURE GRANULOCYTES: 0 10*3/uL (ref 0.0–0.1)
BASOS PCT: 0 %
Basophils Absolute: 0 10*3/uL (ref 0.0–0.1)
Eosinophils Absolute: 0.1 10*3/uL (ref 0.0–0.7)
Eosinophils Relative: 1 %
HEMATOCRIT: 37.7 % (ref 36.0–46.0)
Hemoglobin: 12.3 g/dL (ref 12.0–15.0)
Immature Granulocytes: 1 %
LYMPHS ABS: 1.9 10*3/uL (ref 0.7–4.0)
Lymphocytes Relative: 24 %
MCH: 28.8 pg (ref 26.0–34.0)
MCHC: 32.6 g/dL (ref 30.0–36.0)
MCV: 88.3 fL (ref 78.0–100.0)
MONO ABS: 0.5 10*3/uL (ref 0.1–1.0)
MONOS PCT: 6 %
NEUTROS ABS: 5.4 10*3/uL (ref 1.7–7.7)
Neutrophils Relative %: 68 %
PLATELETS: 330 10*3/uL (ref 150–400)
RBC: 4.27 MIL/uL (ref 3.87–5.11)
RDW: 13.2 % (ref 11.5–15.5)
WBC: 7.9 10*3/uL (ref 4.0–10.5)

## 2017-06-18 LAB — BASIC METABOLIC PANEL
ANION GAP: 8 (ref 5–15)
BUN: 12 mg/dL (ref 6–20)
CHLORIDE: 108 mmol/L (ref 101–111)
CO2: 22 mmol/L (ref 22–32)
Calcium: 8.5 mg/dL — ABNORMAL LOW (ref 8.9–10.3)
Creatinine, Ser: 1.01 mg/dL — ABNORMAL HIGH (ref 0.44–1.00)
GFR calc Af Amer: >60 mL/min (ref >60)
GLUCOSE: 102 mg/dL — AB (ref 65–99)
Potassium: 3.6 mmol/L (ref 3.5–5.1)
Sodium: 138 mmol/L (ref 135–145)

## 2017-06-18 MED ORDER — PROCHLORPERAZINE EDISYLATE 10 MG/2ML IJ SOLN
10.0000 mg | Freq: Once | INTRAMUSCULAR | Status: AC
  Administered 2017-06-18: 10 mg via INTRAVENOUS
  Filled 2017-06-18: qty 2

## 2017-06-18 MED ORDER — DIPHENHYDRAMINE HCL 50 MG/ML IJ SOLN
25.0000 mg | Freq: Once | INTRAMUSCULAR | Status: AC
  Administered 2017-06-18: 23:00:00 via INTRAVENOUS
  Filled 2017-06-18: qty 1

## 2017-06-18 MED ORDER — SODIUM CHLORIDE 0.9 % IV BOLUS
500.0000 mL | Freq: Once | INTRAVENOUS | Status: AC
  Administered 2017-06-18: 500 mL via INTRAVENOUS

## 2017-06-18 NOTE — Telephone Encounter (Signed)
Pt calling with elevated BP. Pt took Ibuprofen for a "throbbing" headache at 1:00 pm. Pt took her BP: at 1:40 her BP was 166/91 and 5 minutes later her BP was 179/97. BP was taken on a automatic home BP machine. Pt does not have a h/o HTN. Pt is in her 2nd month of BCP. Pt does admit to having black spots before her eyes and tired.  Pt advised to be driven to the nearest ED. Care advice given.   Reason for Disposition . [2] Systolic BP  >= 956 OR Diastolic >= 213 AND [0] cardiac or neurologic symptoms (e.g., chest pain, difficulty breathing, unsteady gait, blurred vision)  Answer Assessment - Initial Assessment Questions 1. BLOOD PRESSURE: "What is the blood pressure?" "Did you take at least two measurements 5 minutes apart?"     1:40 166/91 1:45 179/97   2. ONSET: "When did you take your blood pressure?"     1:40 and repeat 1:45 3. HOW: "How did you obtain the blood pressure?" (e.g., visiting nurse, automatic home BP monitor)     Automatic home BP monitor 4. HISTORY: "Do you have a history of high blood pressure?"     no 5. MEDICATIONS: "Are you taking any medications for blood pressure?" "Have you missed any doses recently?"     no 6. OTHER SYMPTOMS: "Do you have any symptoms?" (e.g., headache, chest pain, blurred vision, difficulty breathing, weakness)     Headache (throbbing), black spots before eyes, feels tired 7. PREGNANCY: "Is there any chance you are pregnant?" "When was your last menstrual period?"     LMP: 06/07/17 no chance of pregnancy  Protocols used: HIGH BLOOD PRESSURE-A-AH

## 2017-06-18 NOTE — ED Notes (Signed)
ED Provider at bedside. 

## 2017-06-18 NOTE — ED Triage Notes (Signed)
Patient c/o HA with htn. Called her provider[facility] and [facility] referred her to ED.

## 2017-06-18 NOTE — Discharge Instructions (Signed)
Your elevated blood pressure was likely due to pain. It normalized after your headache was successfully treated. Continue to monitor your blood pressure periodically. Follow-up with your primary care provider.

## 2017-06-18 NOTE — ED Provider Notes (Signed)
[facility] EMERGENCY DEPARTMENT Provider Note   CSN: [id number] Arrival date & time: 06/18/17  2104     History   Chief Complaint Chief Complaint  Patient presents with  . Hypertension    HPI Kelli Ferrell is a 47 y.o. female.  Patient with unremitting headache for most of the day. She checked her blood pressure and it was noted to be elevated (170's/100's). She reports mild blurring of vision. Since arrival in ED, has developed chest tightness. Family history of hypertension, CVA, intracranial mass. No extremity weakness or facial droop. She has been on BCP for one month to treat ovarian cyst pain.  The history is provided by the patient. No language interpreter was used.  Hypertension  This is a new problem. The current episode started 6 to 12 hours ago. The problem has not changed since onset.Associated symptoms include chest pain and headaches. Pertinent negatives include no abdominal pain and no shortness of breath.    Past Medical History:  Diagnosis Date  . Blood in stool   . Dyslipidemia 11/18/2014  . Hypothyroidism   . Kidney stones   . Morbid obesity ([place]) 11/18/2014  . 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 Seasonal allergies 11/22/2015  . Urinary tract infection     Patient Active Problem List   Diagnosis Date Noted  . Odynophagia 06/14/2016  . Rectal bleeding 06/14/2016  . Seasonal allergies 11/22/2015  . Thyroid activity decreased 11/18/2014  . Morbid obesity ([place]) 11/18/2014  . Dyslipidemia 11/18/2014    Past Surgical History:  Procedure Laterality Date  . DILATION AND CURETTAGE OF UTERUS    . UTERINE FIBROID EMBOLIZATION       OB History   None      Home Medications    Prior to Admission medications   Medication Sig Start Date End Date  Taking? Authorizing Provider  amoxicillin-clavulanate (AUGMENTIN) 875-125 MG tablet Take 1 tablet by mouth 2 (two) times daily. 01/17/17   Lucretia Kern, DO  fluticasone (FLONASE) 50 MCG/ACT nasal spray Place into both nostrils daily.    [provider]  levothyroxine (SYNTHROID, LEVOTHROID) 100 MCG tablet TAKE 1 TABLET BY MOUTH  DAILY BEFORE BREAKFAST 03/07/17   Lucretia Kern, DO  pantoprazole (PROTONIX) 40 MG tablet TAKE 1 TABLET BY MOUTH EVERY DAY 06/11/17   Dartha Lodge, PA-C    Family History Family History  Problem Relation Age of Onset  . Diabetes Mother   . Stroke Mother   . Hypertension Mother   . Diabetes Father   . Diabetes Brother     Social History Social History   Tobacco Use  . Smoking status: Never Smoker  . Smokeless tobacco: Never Used  Substance Use Topics  . Alcohol use: Yes    Comment: 1 drink rarely  . Drug use: No     Allergies   Patient has no known allergies.   Review of Systems Review of Systems  Respiratory: Negative for shortness of breath.   Cardiovascular: Positive for chest pain.  Gastrointestinal: Negative for abdominal pain.  Neurological: Positive for headaches. Negative for dizziness, facial asymmetry, speech difficulty and weakness.  All other systems reviewed and are negative.    Physical Exam Updated Vital Signs BP (!) 159/85 (BP Location: Right Arm)   Pulse 69  Temp 98.2 F (36.8 C) (Oral)   Resp 16   Ht 5\' 1"  (1.549 m)   Wt 98.9 kg (218 lb)   LMP 06/07/2017   SpO2 100%   BMI 41.19 kg/m   Physical Exam  Constitutional: She is oriented to person, place, and time. She appears well-developed and well-nourished.  HENT:  Head: Normocephalic.  Eyes: Pupils are equal, round, and reactive to light. EOM are normal.  Neck: Neck supple.  Cardiovascular: Normal rate and regular rhythm.  Pulmonary/Chest: Effort normal and breath sounds normal.  Abdominal: Soft. Bowel sounds are normal.  Musculoskeletal: Normal range of  motion. She exhibits no edema or tenderness.  Neurological: She is alert and oriented to person, place, and time. No cranial nerve deficit or sensory deficit. Coordination normal.  Skin: Skin is warm and dry.  Psychiatric: She has a normal mood and affect.  Nursing note and vitals reviewed.    ED Treatments / Results  Labs (all labs ordered are listed, but only abnormal results are displayed) Labs Reviewed  CBC WITH DIFFERENTIAL/PLATELET  BASIC METABOLIC PANEL    EKG None  Radiology No results found.  Procedures Procedures (including critical care time)  Medications Ordered in ED Medications  sodium chloride 0.9 % bolus 500 mL (500 mLs Intravenous New Bag/Given 06/18/17 2235)  prochlorperazine (COMPAZINE) injection 10 mg (10 mg Intravenous Given 06/18/17 2235)  diphenhydrAMINE (BENADRYL) injection 25 mg ( Intravenous Given 06/18/17 2235)     Initial Impression / Assessment and Plan / ED Course  I have reviewed the triage vital signs and the nursing notes.  Pertinent labs & imaging results that were available during my care of the patient were reviewed by me and considered in my medical decision making (see chart for details).     Headache treated successfully. After headache subsided, blood pressure returned to normal range (129/71).  Pt HA treated and improved while in ED.  Presentation is not concerning for [facility], ICH, Meningitis, or temporal arteritis. Pt is afebrile with no focal neuro deficits, nuchal rigidity, or change in vision. Pt is to follow up with PCP to discuss prophylactic medication. Pt verbalizes understanding and is agreeable with plan to dc.   Patient noted to be hypertensive in the emergency department.  No signs of hypertensive urgency.  Discussed with patient the need for close follow-up and management by their primary care physician.   Final Clinical Impressions(s) / ED Diagnoses   Final diagnoses:  Hypertension, unspecified type  Acute nonintractable  headache, unspecified headache type    ED Discharge Orders    None       Etta Quill, NP 06/19/17 0132    Rex Kras, Wenda Overland, MD 06/19/17 [phone]

## 2017-06-19 ENCOUNTER — Other Ambulatory Visit: Payer: Self-pay | Admitting: Emergency Medicine

## 2017-06-19 MED ORDER — PANTOPRAZOLE SODIUM 40 MG PO TBEC: 40.0000 mg | DELAYED_RELEASE_TABLET | Freq: Every day | ORAL | 1 refills | Status: DC

## 2017-06-19 NOTE — Progress Notes (Signed)
HPI:  Using dictation device. Unfortunately this device frequently misinterprets words/phrases. Due for thyroid recheck and mammogram  Follow up ER visit for HTN, HA, CP -treated with benadryl and compazine in ER with resolution per notes  A few days ago - she reports headaches continued but never as bad as then (worst HA) -no imaging was done, cbc ok, bmp with mildly elevated Cr and Glu -today reports: started OCPs with her gyn for ovarian cysts and since then BP increasing and had some headaches with this, stopped OCPs a few days ago and headaches better, bp a little better since but still some elevated BP at home, mild sinus issues and a little sinus pain today, vision feels a little cloudy at times - thinks is from getting used to bifocals - does not like them -lots of stress at home with adopted daughter 40 with developmental delays -sister and mother with benign brain "cysts" and mother had associated stroke in her 71s with this per pt - pt is worried about this -denies: PC, SOB, fevers, HA today, no CP  -inadequate exercise and diet  ROS: See pertinent positives and negatives per HPI.  Past Medical History:  Diagnosis Date  . Blood in stool   . Dyslipidemia 11/18/2014  . Hypothyroidism   . Kidney stones   . Morbid obesity ([place]) 11/18/2014  . 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 Seasonal allergies 11/22/2015  . Urinary tract infection     Past Surgical History:  Procedure Laterality Date  . DILATION AND CURETTAGE OF UTERUS    . UTERINE FIBROID EMBOLIZATION      Family History  Problem Relation Age of Onset  . Diabetes Mother   . Stroke Mother   . Hypertension Mother   . Diabetes Father   . Diabetes Brother     SOCIAL HX: see hpi   Current Outpatient Medications:  .  fluticasone  (FLONASE) 50 MCG/ACT nasal spray, Place into both nostrils daily., Disp: , Rfl:  .  levothyroxine (SYNTHROID, LEVOTHROID) 100 MCG tablet, TAKE 1 TABLET BY MOUTH  DAILY BEFORE BREAKFAST, Disp: 90 tablet, Rfl: 1 .  Norethindrone-Ethinyl Estradiol-Fe Biphas (LO LOESTRIN FE) 1 MG-10 MCG / 10 MCG tablet, Lo Loestrin Fe 1 mg-10 mcg (24)/10 mcg (2) tablet  TAKE 1 TABLET BY MOUTH ONCE DAILY, Disp: , Rfl:  .  pantoprazole (PROTONIX) 40 MG tablet, Take 1 tablet (40 mg total) by mouth daily., Disp: 30 tablet, Rfl: 1  EXAM:  Vitals:   06/20/17 1012  BP: 118/84  Pulse: 69  Temp: 98.4 F (36.9 C)  SpO2: 98%    Body mass index is 41.02 kg/m.  GENERAL: vitals reviewed and listed above, alert, oriented, appears well hydrated and in no acute distress  HEENT: atraumatic, conjunttiva clear, PERRLA, visual acuity grossly intact, no obvious abnormalities on inspection of external nose and ears  NECK: no obvious masses on inspection  LUNGS: clear to auscultation bilaterally, no wheezes, rales or rhonchi, good air movement  CV: HRRR, no peripheral edema  MS: moves all extremities without noticeable abnormality  PSYCH/NEURO: pleasant and cooperative, no obvious depression or anxiety, CN II-XII grossly intact, finger to nose normal, speech and thought processing grossly intact, gait normal  ASSESSMENT AND PLAN:  Discussed the following assessment and plan:  Elevated blood pressure reading  Morbid obesity ([place])  Dyslipidemia  Acute nonintractable headache, unspecified headache type  Nonintractable headache, unspecified chronicity pattern, unspecified headache type  -numerous potential causes of her headaches - discussed at length -she is worried about a stroke or cyst in the brain and discussed various options for evaluation - she opted for outpt MRI - advise ER if worset headache of life or other symptoms in the interim - today ha resolved -labs per orders -lifestyle recs, 10-20 lb wt  reduction advised over the next 3-6 months -advised CBT for stress -advised follow up with her eye doctor for recheck -continue off OCPs- she will consult ob -recheck in 1 month and antihypertensive if BP remains elevated -Patient advised to return or notify a doctor immediately if symptoms worsen or persist or new concerns arise.  Patient Instructions  BEFORE YOU LEAVE: -labs -follow up: 1 month  -We placed a referral for you as discussed for the MRI. It usually takes about 1-2 weeks to process and schedule this referral. If you have not heard from [place] regarding this appointment in 2 weeks please contact our office.  We have ordered labs or studies at this visit. It can take up to 1-2 weeks for results and processing. IF results require follow up or explanation, we will call you with instructions. Clinically stable results will be released to your [facility]. If you have not heard from [place] or cannot find your results in [facility] in 2 weeks please contact our office at [phone].  If you are not yet signed up for [facility] please consider signing up.  Get and eye exam with your eye doctor.  Try to lose 10-20 lbs in the next 3-6 months. Eat a healthy low sugar/low carb diet. Avoid sweets, artificial sweetener, processed foods, fast foods, junk food and simple starches. Eat los for veggies. Avoid lots of fruit - small amounts of berried ok. Get 150 minutes of aerobic exercise per week. Let your gynecologist know about the blood pressure issues with the hormones.  Look into the free counseling for the stress.  I hope you are feeling better soon! Seek care promptly if your symptoms worsen, new concerns arise or you are not improving with treatment.       Lucretia Kern, DO

## 2017-06-19 NOTE — ED Notes (Signed)
Pt departed in NAD, refused use of wheelchair.  

## 2017-06-19 NOTE — Telephone Encounter (Signed)
Confirmed patient went to ER yesterday.

## 2017-06-20 ENCOUNTER — Encounter: Payer: Self-pay | Admitting: Family Medicine

## 2017-06-20 ENCOUNTER — Ambulatory Visit: Payer: 59 | Admitting: Family Medicine

## 2017-06-20 VITALS — BP 118/84 | HR 69 | Temp 98.4°F | Ht 61.0 in | Wt 217.1 lb

## 2017-06-20 DIAGNOSIS — R51 Headache: Secondary | ICD-10-CM

## 2017-06-20 DIAGNOSIS — E785 Hyperlipidemia, unspecified: Secondary | ICD-10-CM | POA: Diagnosis not present

## 2017-06-20 DIAGNOSIS — R519 Headache, unspecified: Secondary | ICD-10-CM

## 2017-06-20 DIAGNOSIS — R030 Elevated blood-pressure reading, without diagnosis of hypertension: Secondary | ICD-10-CM

## 2017-06-20 LAB — BASIC METABOLIC PANEL
BUN: 12 mg/dL (ref 6–23)
CHLORIDE: 105 meq/L (ref 96–112)
CO2: 25 meq/L (ref 19–32)
Calcium: 9.2 mg/dL (ref 8.4–10.5)
Creatinine, Ser: 0.81 mg/dL (ref 0.40–1.20)
GFR: 80.85 mL/min (ref >60.00)
Glucose, Bld: 81 mg/dL (ref 70–99)
POTASSIUM: 4.5 meq/L (ref 3.5–5.1)
Sodium: 138 mEq/L (ref 135–145)

## 2017-06-20 LAB — TSH: TSH: 2.59 u[IU]/mL (ref 0.35–4.50)

## 2017-06-20 LAB — HEMOGLOBIN A1C: HEMOGLOBIN A1C: 5.5 % (ref 4.6–6.5)

## 2017-06-20 NOTE — Patient Instructions (Signed)
BEFORE YOU LEAVE: -labs -follow up: 1 month  -We placed a referral for you as discussed for the MRI. It usually takes about 1-2 weeks to process and schedule this referral. If you have not heard from [place] regarding this appointment in 2 weeks please contact our office.  We have ordered labs or studies at this visit. It can take up to 1-2 weeks for results and processing. IF results require follow up or explanation, we will call you with instructions. Clinically stable results will be released to your [facility]. If you have not heard from [place] or cannot find your results in [facility] in 2 weeks please contact our office at [phone].  If you are not yet signed up for [facility], please consider signing up.  Get and eye exam with your eye doctor.  Try to lose 10-20 lbs in the next 3-6 months. Eat a healthy low sugar/low carb diet. Avoid sweets, artificial sweetener, processed foods, fast foods, junk food and simple starches. Eat los for veggies. Avoid lots of fruit - small amounts of berried ok. Get 150 minutes of aerobic exercise per week. Let your gynecologist know about the blood pressure issues with the hormones.  Look into the free counseling for the stress.  I hope you are feeling better soon! Seek care promptly if your symptoms worsen, new concerns arise or you are not improving with treatment.

## 2017-07-01 DIAGNOSIS — Z1231 Encounter for screening mammogram for malignant neoplasm of breast: Secondary | ICD-10-CM | POA: Diagnosis not present

## 2017-07-01 LAB — HM MAMMOGRAPHY

## 2017-07-05 ENCOUNTER — Other Ambulatory Visit: Payer: 59

## 2017-07-09 ENCOUNTER — Encounter: Payer: Self-pay | Admitting: Family Medicine

## 2017-07-09 DIAGNOSIS — R102 Pelvic and perineal pain: Secondary | ICD-10-CM | POA: Diagnosis not present

## 2017-07-09 DIAGNOSIS — N83201 Unspecified ovarian cyst, right side: Secondary | ICD-10-CM | POA: Diagnosis not present

## 2017-07-12 ENCOUNTER — Ambulatory Visit
Admission: RE | Admit: 2017-07-12 | Discharge: 2017-07-12 | Disposition: A | Discharge status: Home / Self Care | Payer: 59 | Source: Ambulatory Visit | Attending: Family Medicine | Admitting: Family Medicine

## 2017-07-12 DIAGNOSIS — R51 Headache: Secondary | ICD-10-CM | POA: Diagnosis not present

## 2017-07-12 DIAGNOSIS — R519 Headache, unspecified: Secondary | ICD-10-CM

## 2017-07-12 MED ORDER — GADOBENATE DIMEGLUMINE 529 MG/ML IV SOLN
20.0000 mL | Freq: Once | INTRAVENOUS | Status: AC | PRN
  Administered 2017-07-12: 20 mL via INTRAVENOUS

## 2017-07-25 ENCOUNTER — Ambulatory Visit: Payer: 59 | Admitting: Family Medicine

## 2017-07-25 DIAGNOSIS — Z0289 Encounter for other administrative examinations: Secondary | ICD-10-CM

## 2017-07-25 NOTE — Progress Notes (Deleted)
  HPI:  Using dictation device. Unfortunately this device frequently misinterprets words/phrases.  Follow up multiple concerns. Today reports. Denies.  Elevated BP: -after starting OCPs for ovarian cysts -improved with stopping - here to recheck -labs ok 06/20/17  Stress: -caring for adopted daughter with disanilities  Headaches: -with BP and OCPs and stress -normal MRI 06/2017  Morbid obesity: -advised lifestyle changes for wt reduction  Hypothyroidism: -on levothyroxine -level ok 06/2017  GERD: -on PPI  ROS: See pertinent positives and negatives per HPI.  Past Medical History:  Diagnosis Date  . Blood in stool   . Dyslipidemia 11/18/2014  . Hypothyroidism   . Kidney stones   . Morbid obesity ([place]) 11/18/2014  . 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 Seasonal allergies 11/22/2015  . Urinary tract infection     Past Surgical History:  Procedure Laterality Date  . DILATION AND CURETTAGE OF UTERUS    . UTERINE FIBROID EMBOLIZATION      Family History  Problem Relation Age of Onset  . Diabetes Mother   . Stroke Mother   . Hypertension Mother   . Diabetes Father   . Diabetes Brother     SOCIAL HX: ***   Current Outpatient Medications:  .  fluticasone (FLONASE) 50 MCG/ACT nasal spray, Place into both nostrils daily., Disp: , Rfl:  .  levothyroxine (SYNTHROID, LEVOTHROID) 100 MCG tablet, TAKE 1 TABLET BY MOUTH  DAILY BEFORE BREAKFAST, Disp: 90 tablet, Rfl: 1 .  Norethindrone-Ethinyl Estradiol-Fe Biphas (LO LOESTRIN FE) 1 MG-10 MCG / 10 MCG tablet, Lo Loestrin Fe 1 mg-10 mcg (24)/10 mcg (2) tablet  TAKE 1 TABLET BY MOUTH ONCE DAILY, Disp: , Rfl:  .  pantoprazole (PROTONIX) 40 MG tablet, Take 1 tablet (40 mg total) by mouth daily., Disp: 30 tablet, Rfl: 1  EXAM:  There were no vitals  filed for this visit.  There is no height or weight on file to calculate BMI.  GENERAL: vitals reviewed and listed above, alert, oriented, appears well hydrated and in no acute distress  HEENT: atraumatic, conjunttiva clear, no obvious abnormalities on inspection of external nose and ears  NECK: no obvious masses on inspection  LUNGS: clear to auscultation bilaterally, no wheezes, rales or rhonchi, good air movement  CV: HRRR, no peripheral edema  MS: moves all extremities without noticeable abnormality *** PSYCH: pleasant and cooperative, no obvious depression or anxiety  ASSESSMENT AND PLAN:  Discussed the following assessment and plan:  No diagnosis found.  *** -Patient advised to return or notify a doctor immediately if symptoms worsen or persist or new concerns arise.  There are no Patient Instructions on file for this visit.  Lucretia Kern, DO

## 2017-07-29 NOTE — Progress Notes (Signed)
HPI:  Using dictation device. Unfortunately this device frequently misinterprets words/phrases.  Follow up multiple issues:  Elevated BP, stress, HAs: -had MRI and labs - all ok -advised CBT, eye exam, wt reduction and antihypertensive if BP remained elevated -thought likely multifactorial from stress and OCPs (from gyn for IMB) -today reports: BP better but still with headaches most days, around bilat eyes, feels is sinus related - has nasal congestion, had some fullness R ear a few days ago with a gland in the neck -denies:fevers, migraines, vomiting -hx nasal polyps -using flonase, zyrtec and recently started nasal saline  Hypothyroidism: -TSH normal recently -on levothyroxine  Morbid obesity: -healthy lifestyle and wt reduction advised  ROS: See pertinent positives and negatives per HPI.  Past Medical History:  Diagnosis Date  . Blood in stool   . Dyslipidemia 11/18/2014  . Hypothyroidism   . Kidney stones   . Morbid obesity ([place]) 11/18/2014  . 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 Seasonal allergies 11/22/2015  . Urinary tract infection     Past Surgical History:  Procedure Laterality Date  . DILATION AND CURETTAGE OF UTERUS    . UTERINE FIBROID EMBOLIZATION      Family History  Problem Relation Age of Onset  . Diabetes Mother   . Stroke Mother   . Hypertension Mother   . Diabetes Father   . Diabetes Brother     SOCIAL HX: see hpi   Current Outpatient Medications:  .  fluticasone (FLONASE) 50 MCG/ACT nasal spray, Place into both nostrils daily., Disp: , Rfl:  .  levothyroxine (SYNTHROID, LEVOTHROID) 100 MCG tablet, TAKE 1 TABLET BY MOUTH  DAILY BEFORE BREAKFAST, Disp: 90 tablet, Rfl: 1 .  pantoprazole (PROTONIX) 40 MG tablet, Take 1 tablet (40 mg total) by mouth daily., Disp:  30 tablet, Rfl: 1 .  predniSONE (DELTASONE) 20 MG tablet, Take 1 tablet (20 mg total) by mouth daily with breakfast., Disp: 5 tablet, Rfl: 0  EXAM:  Vitals:   07/30/17 1133  BP: 110/80  Pulse: 71  Temp: 98.4 F (36.9 C)    Body mass index is 40.02 kg/m.  GENERAL: vitals reviewed and listed above, alert, oriented, appears well hydrated and in no acute distress  HEENT: atraumatic, conjunttiva clear, no obvious abnormalities on inspection of external nose and ears, normal appearance of ear canals and TMs with clear effusion R, clear nasal congestion with boggy turbinates, mild post oropharyngeal erythema with PND, no tonsillar edema or exudate, no sinus TTP  NECK: no obvious masses on inspection  LUNGS: clear to auscultation bilaterally, no wheezes, rales or rhonchi, good air movement  CV: HRRR, no peripheral edema  MS: moves all extremities without noticeable abnormality  PSYCH/NEURO: pleasant and cooperative, no obvious depression or anxiety, CN II-XII grossly intact, speech and thought processing grossly intact  ASSESSMENT AND PLAN:  Discussed the following assessment and plan:  Chronic intractable headache, unspecified headache type - Plan: Sedimentation Rate  Sinus congestion  Elevated blood pressure reading  -we discussed possible serious and likely etiologies, workup and treatment, treatment risks and return precautions; Bp better -after this discussion, Ivis opted for trial prednisone as she really feels allergies/sinuses playing a role,check esr given ongoing headache - no sig findings on exam to suggest TA, neurology eval if HAs not resolving -follow up  advised 1 month -of course, we advised Erikka  to return or notify a doctor immediately if symptoms worsen or persist or new concerns arise.  Patient Instructions  BEFORE YOU LEAVE: -labs -follow up: 1 months  Take the prednisone once daily for 5 days. If headaches not resolved would advise neurology  evaluation. Please call to let us know.  Please get counseling for stress and work on a very healthy low sugar diet and regular exercise.    Lucretia Kern, DO

## 2017-07-30 ENCOUNTER — Encounter: Payer: Self-pay | Admitting: Family Medicine

## 2017-07-30 ENCOUNTER — Ambulatory Visit: Payer: 59 | Admitting: Family Medicine

## 2017-07-30 VITALS — BP 110/80 | HR 71 | Temp 98.4°F | Ht 61.0 in | Wt 211.8 lb

## 2017-07-30 DIAGNOSIS — R700 Elevated erythrocyte sedimentation rate: Secondary | ICD-10-CM | POA: Diagnosis not present

## 2017-07-30 DIAGNOSIS — R51 Headache: Secondary | ICD-10-CM | POA: Diagnosis not present

## 2017-07-30 DIAGNOSIS — R0981 Nasal congestion: Secondary | ICD-10-CM | POA: Diagnosis not present

## 2017-07-30 DIAGNOSIS — G8929 Other chronic pain: Secondary | ICD-10-CM

## 2017-07-30 DIAGNOSIS — R030 Elevated blood-pressure reading, without diagnosis of hypertension: Secondary | ICD-10-CM | POA: Diagnosis not present

## 2017-07-30 LAB — SEDIMENTATION RATE: SED RATE: 33 mm/h — AB (ref 0–20)

## 2017-07-30 MED ORDER — PREDNISONE 20 MG PO TABS: 20.0000 mg | ORAL_TABLET | Freq: Every day | ORAL | 0 refills | Status: DC

## 2017-07-30 NOTE — Patient Instructions (Addendum)
BEFORE YOU LEAVE: -labs -follow up: 1 months  Take the prednisone once daily for 5 days. If headaches not resolved would advise neurology evaluation. Please call to let us know.  Please get counseling for stress and work on a very healthy low sugar diet and regular exercise.

## 2017-07-30 NOTE — Addendum Note (Signed)
Addended by: Agnes Lawrence on: 07/30/2017 03:49 PM   Modules accepted: Orders

## 2017-08-01 ENCOUNTER — Encounter: Payer: Self-pay | Admitting: Family Medicine

## 2017-08-01 DIAGNOSIS — J310 Chronic rhinitis: Secondary | ICD-10-CM | POA: Diagnosis not present

## 2017-08-01 DIAGNOSIS — G44201 Tension-type headache, unspecified, intractable: Secondary | ICD-10-CM | POA: Diagnosis not present

## 2017-08-01 NOTE — Telephone Encounter (Signed)
I would recommend neurology evaluation if headaches persist despite the prednisone and ENT did not feels was sinus related and could not help in terms of eval for the low likely hood of temporal arteritis we discussed. Thanks.

## 2017-09-02 NOTE — Progress Notes (Signed)
  HPI:  Using dictation device. Unfortunately this device frequently misinterprets words/phrases.  Follow up headaches: -initially onset was with stress and OCP, elevated BP from the OCPs -BP improved, but then had some sinus issues and continued to have headaches -had neg MRI, ENT eval and was rxd prednisone -recommended neurology eval if headaches persisted -reports finally symptoms have resolved, still a lot of stress at home, but only has headaches if skips her coffee   ROS: See pertinent positives and negatives per HPI.  Past Medical History:  Diagnosis Date  . Blood in stool   . Dyslipidemia 11/18/2014  . Hypothyroidism   . Kidney stones   . Morbid obesity ([place]) 11/18/2014  . 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 Seasonal allergies 11/22/2015  . Urinary tract infection     Past Surgical History:  Procedure Laterality Date  . DILATION AND CURETTAGE OF UTERUS    . UTERINE FIBROID EMBOLIZATION      Family History  Problem Relation Age of Onset  . Diabetes Mother   . Stroke Mother   . Hypertension Mother   . Diabetes Father   . Diabetes Brother     SOCIAL HX: see hpi   Current Outpatient Medications:  .  fluticasone (FLONASE) 50 MCG/ACT nasal spray, Place into both nostrils daily., Disp: , Rfl:  .  levothyroxine (SYNTHROID, LEVOTHROID) 100 MCG tablet, TAKE 1 TABLET BY MOUTH  DAILY BEFORE BREAKFAST, Disp: 90 tablet, Rfl: 1 .  pantoprazole (PROTONIX) 40 MG tablet, Take 1 tablet (40 mg total) by mouth daily., Disp: 30 tablet, Rfl: 1  EXAM:  Vitals:   09/03/17 0914  BP: 110/70  Pulse: (!) 55  Temp: 97.9 F (36.6 C)    Body mass index is 39.64 kg/m.  GENERAL: vitals reviewed and listed above, alert, oriented, appears well hydrated and in no acute distress  HEENT: atraumatic,  conjunttiva clear, no obvious abnormalities on inspection of external nose and ears, normal appearance of ear canals and TMs, clear nasal congestion, mild post oropharyngeal erythema with PND, no tonsillar edema or exudate, no sinus TTP  NECK: no obvious masses on inspection  LUNGS: clear to auscultation bilaterally, no wheezes, rales or rhonchi, good air movement  CV: HRRR, no peripheral edema  MS: moves all extremities without noticeable abnormality  PSYCH: pleasant and cooperative, no obvious depression or anxiety, speech and thought processing grossly intact, CNs grossly intact  ASSESSMENT AND PLAN:  Discussed the following assessment and plan:  Frequent headaches  Morbid obesity ([facility])  Hypothyroidism, unspecified type  Stress  -doing much better -advised she call if headache recur -follow up otherwise in 3 months, sooner if needed  There are no Patient Instructions on file for this visit.  Lucretia Kern, DO

## 2017-09-03 ENCOUNTER — Ambulatory Visit: Payer: 59 | Admitting: Family Medicine

## 2017-09-03 ENCOUNTER — Encounter: Payer: Self-pay | Admitting: Family Medicine

## 2017-09-03 VITALS — BP 110/70 | HR 55 | Temp 97.9°F | Ht 61.0 in | Wt 209.8 lb

## 2017-09-03 DIAGNOSIS — E039 Hypothyroidism, unspecified: Secondary | ICD-10-CM

## 2017-09-03 DIAGNOSIS — R51 Headache: Secondary | ICD-10-CM

## 2017-09-03 DIAGNOSIS — F439 Reaction to severe stress, unspecified: Secondary | ICD-10-CM

## 2017-09-03 DIAGNOSIS — R519 Headache, unspecified: Secondary | ICD-10-CM

## 2017-11-08 ENCOUNTER — Other Ambulatory Visit: Payer: Self-pay | Admitting: Gastroenterology

## 2017-11-17 ENCOUNTER — Encounter: Payer: Self-pay | Admitting: Family Medicine

## 2017-11-18 MED ORDER — LEVOTHYROXINE SODIUM 100 MCG PO TABS: ORAL_TABLET | ORAL | 1 refills | Status: DC

## 2018-01-04 IMAGING — DX DG CHEST 2V
2 series · 2 of 2 positions shown · non-contrast
Comparison: None.

CLINICAL DATA: Positive PPD

EXAM:
CHEST  2 VIEW

[chest pa]
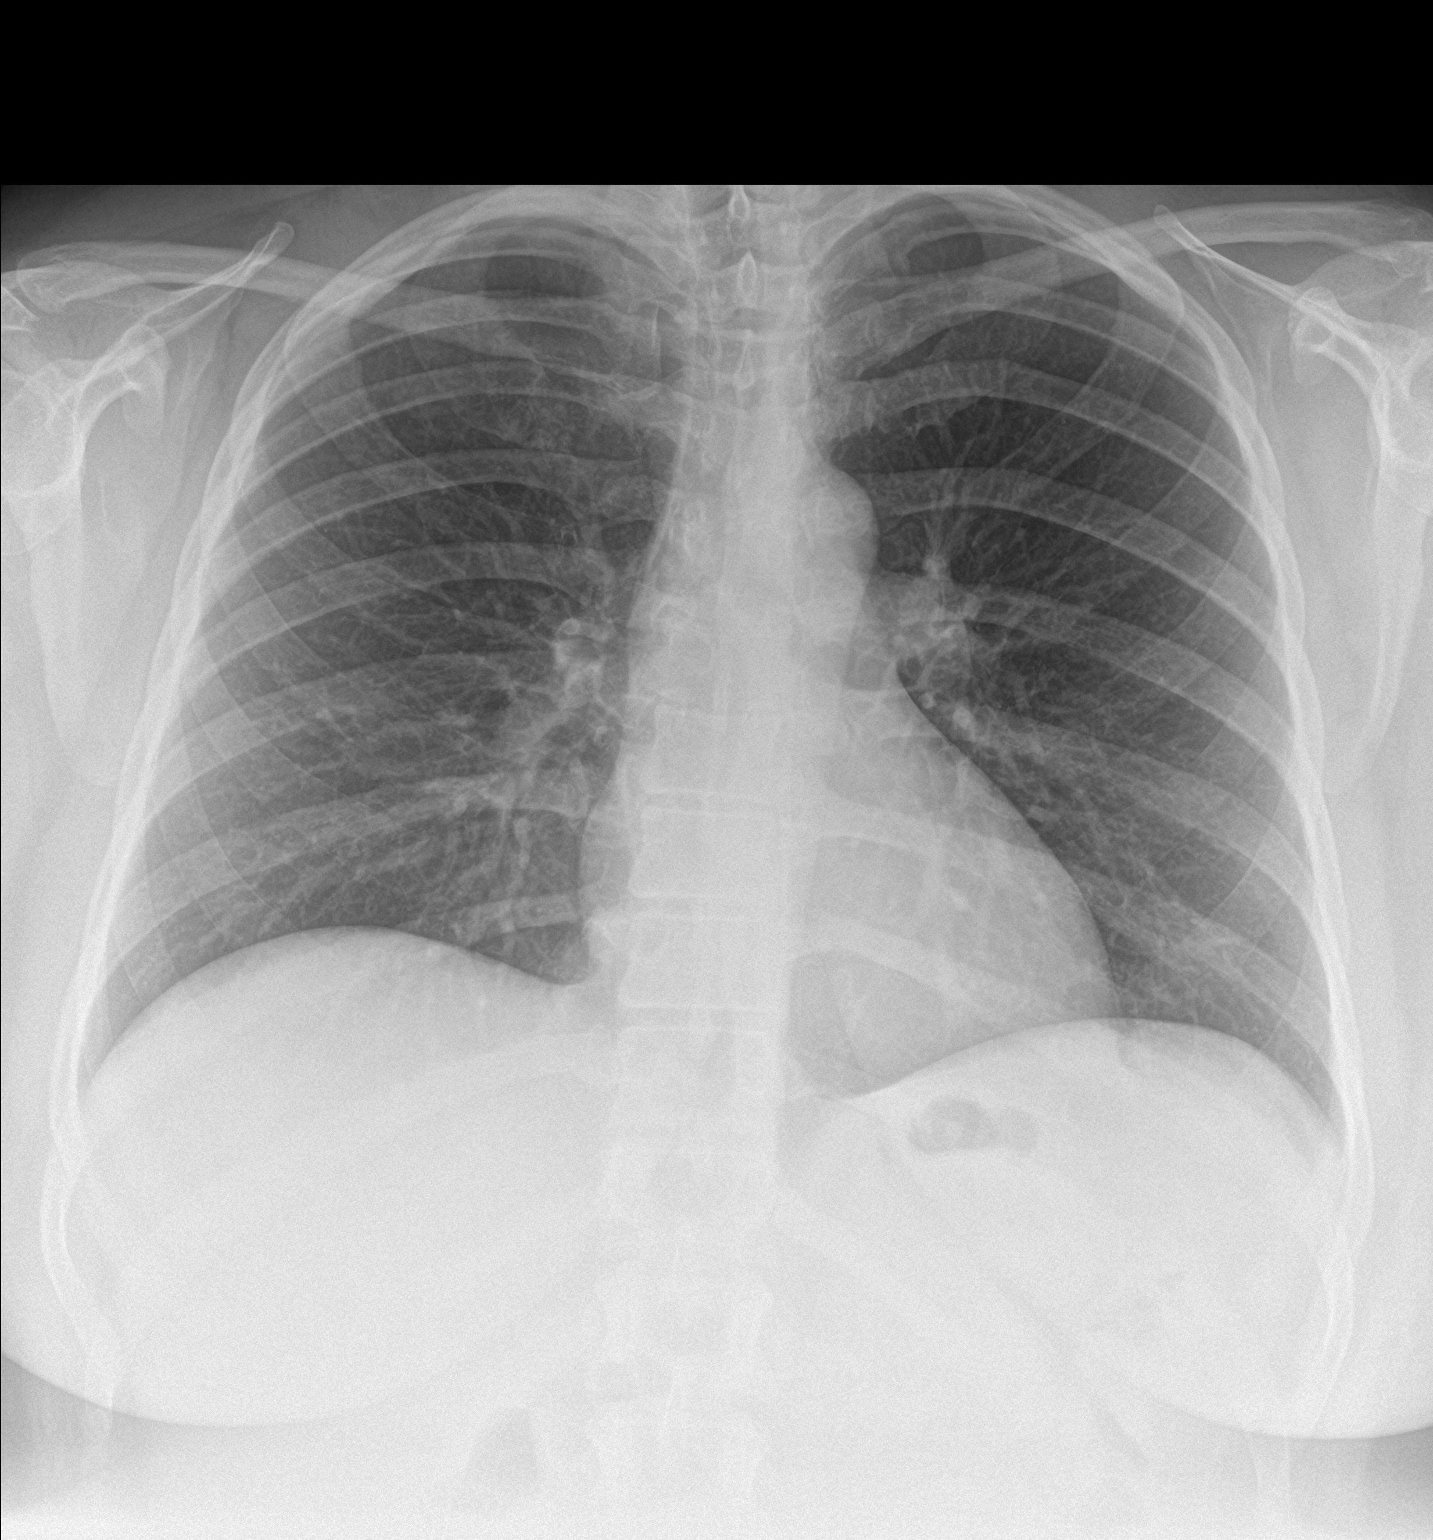

[chest lat]
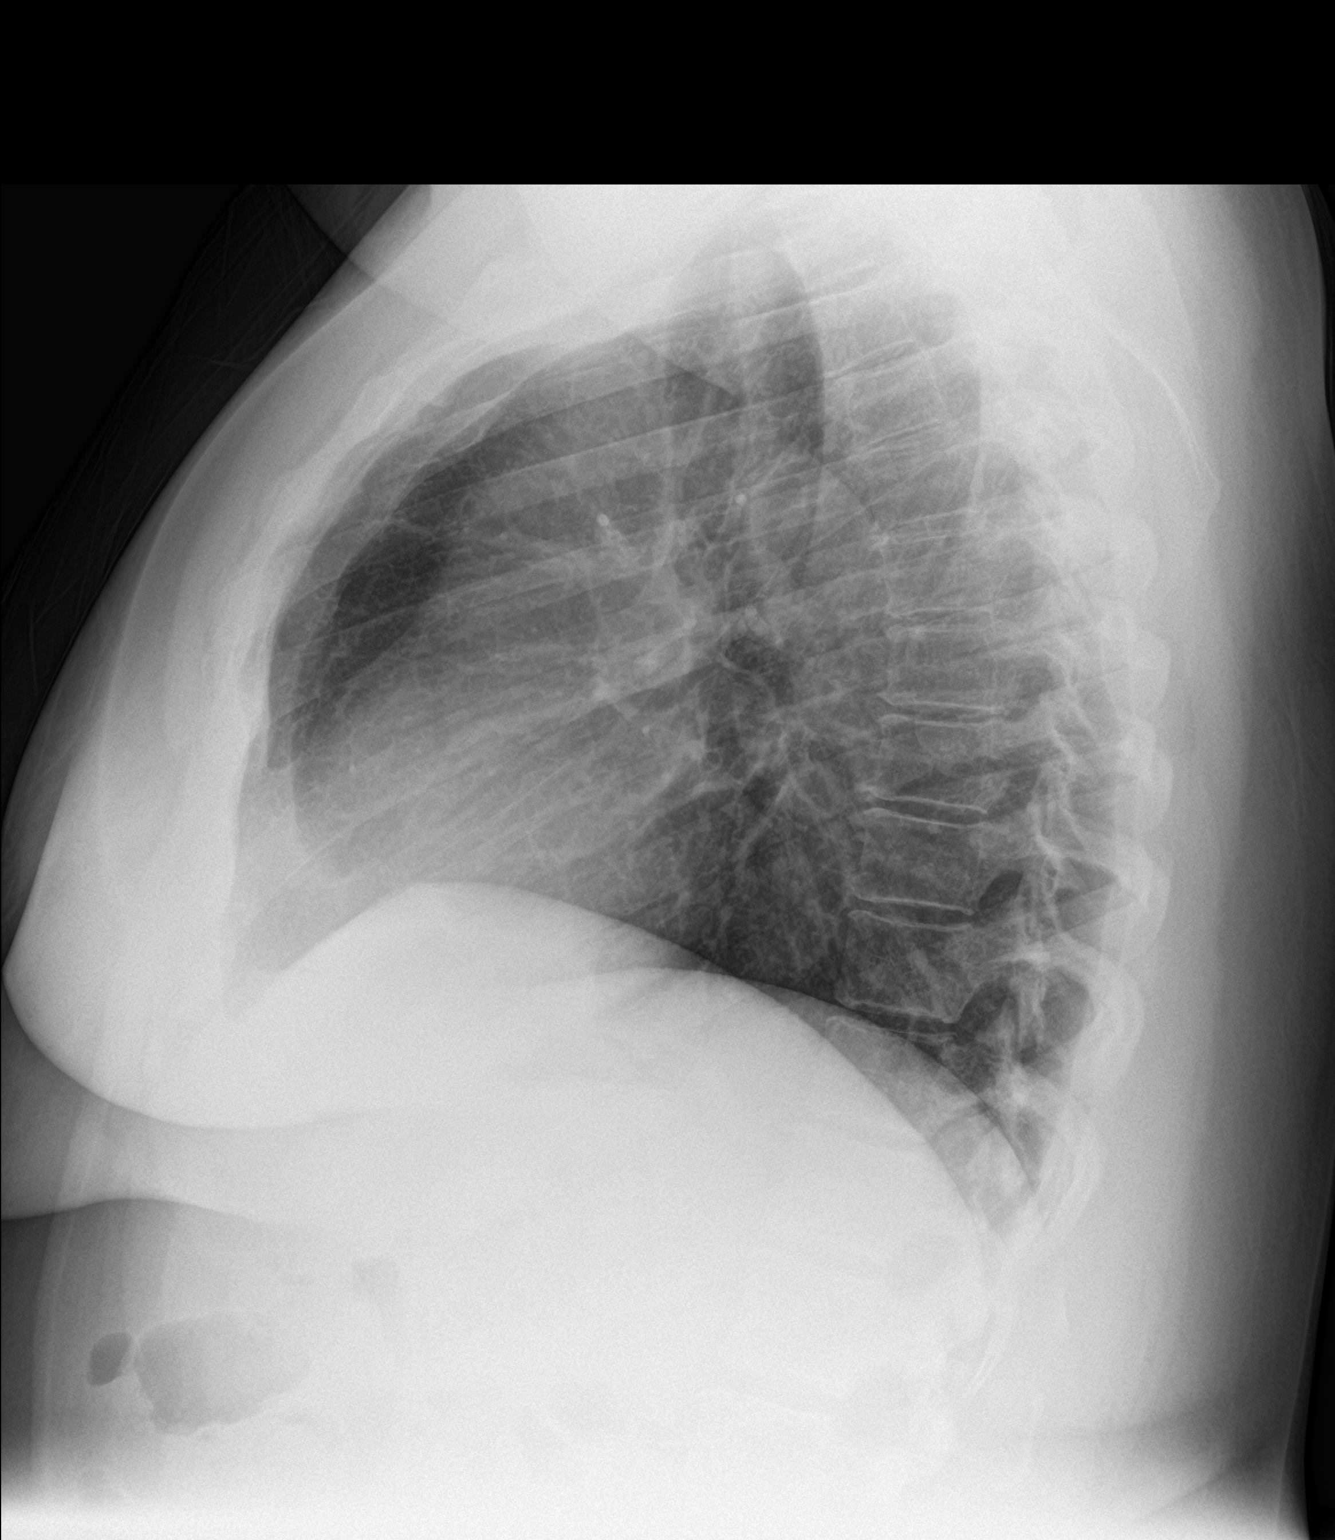

[2 of 2 positions shown; findings below may reference images not displayed]

FINDINGS: The heart size and mediastinal contours are within normal limits.
Both lungs are clear. The visualized skeletal structures are
unremarkable.
IMPRESSION: No active cardiopulmonary disease.

## 2018-02-15 NOTE — Progress Notes (Signed)
HPI:  Using dictation device. Unfortunately this device frequently misinterprets words/phrases.  Here for CPE:  -Concerns and/or follow up today:   She takes levothyroxine for hypothyroidism and pantoprazole for GERD rxd by GI.  Sees Dr. Terri Piedra in gyn for pap, mammo, women's health. Due for tetanus, flu vaccine, labs, gyn visit Extra stress and mild recurrent depression, situational related to caring for foster child with genetic condition, chromosome 15 extra. Behavioral issues. Feels safe. No thoughts of harm.  -Diet: variety of foods, balance and well rounded, larger portion sizes -Exercise: no regular exercise -Taking folic acid, vitamin D or calcium: no -Diabetes and Dyslipidemia Screening: fasting for labs -Vaccines: see vaccine section EPIC -pap history: sees gyn -FDLMP: see nursing note -wants STI testing (Hep C if born [birth year]): no -FH breast, colon or ovarian ca: see FH Last mammogram: n/a, sees gyn Last colon cancer screening: n/a Breast Ca Risk Assessment: see family history and pt history DEXA (>/= 65): n/a  -Alcohol, Tobacco, drug use: see social history  Review of Systems - no reported fevers, unintentional weight loss, vision loss, hearing loss, chest pain, sob, hemoptysis, melena, hematochezia, hematuria, genital discharge, changing or concerning skin lesions, bleeding, bruising, loc, thoughts of self harm or SI  Past Medical History:  Diagnosis Date  . Blood in stool   . Dyslipidemia 11/18/2014  . Hypothyroidism   . Kidney stones   . Morbid obesity ([place]) 11/18/2014  . 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 Seasonal allergies 11/22/2015  . Urinary tract infection     Past Surgical History:  Procedure Laterality Date  . DILATION AND CURETTAGE OF UTERUS    . UTERINE FIBROID  EMBOLIZATION      Family History  Problem Relation Age of Onset  . Diabetes Mother   . Stroke Mother   . Hypertension Mother   . Diabetes Father   . Diabetes Brother     Social History   Socioeconomic History  . Marital status: Married    Spouse name: Not on file  . Number of children: Not on file  . Years of education: Not on file  . Highest education level: Not on file  Occupational History  . Not on file  Social Needs  . Financial resource strain: Not on file  . Food insecurity:    Worry: Not on file    Inability: Not on file  . Transportation needs:    Medical: Not on file    Non-medical: Not on file  Tobacco Use  . Smoking status: Never Smoker  . Smokeless tobacco: Never Used  Substance and Sexual Activity  . Alcohol use: Yes    Comment: 1 drink rarely  . Drug use: No  . Sexual activity: Yes    Birth control/protection: Other-see comments    Comment: tubal  Lifestyle  . Physical activity:    Days per week: Not on file    Minutes per session: Not on file  . Stress: Not on file  Relationships  . Social connections:    Talks on phone: Not on file    Gets together: Not on file    Attends religious service: Not on file    Active member of club or organization: Not on file    Attends meetings of clubs or organizations: Not on file  Relationship status: Not on file  Other Topics Concern  . Not on file  Social History Narrative   Work or School: [employer], music      Home Situation: lives with husband and two children      Spiritual Beliefs: Christian      Lifestyle: regular exercise and healthy diet              Current Outpatient Medications:  .  fluticasone (FLONASE) 50 MCG/ACT nasal spray, Place into both nostrils daily., Disp: , Rfl:  .  levothyroxine (SYNTHROID, LEVOTHROID) 100 MCG tablet, TAKE 1 TABLET BY MOUTH  DAILY BEFORE BREAKFAST, Disp: 90 tablet, Rfl: 1 .  pantoprazole (PROTONIX) 40 MG tablet, Take 1 tablet (40 mg  total) by mouth daily., Disp: 30 tablet, Rfl: 3  EXAM:  Vitals:   02/18/18 0752  BP: 112/62  Pulse: 71  Temp: 98.2 F (36.8 C)  Body mass index is 38.62 kg/m.   GENERAL: vitals reviewed and listed below, alert, oriented, appears well hydrated and in no acute distress  HEENT: head atraumatic, PERRLA, normal appearance of eyes, ears, nose and mouth. moist mucus membranes.  NECK: supple, no masses or lymphadenopathy  LUNGS: clear to auscultation bilaterally, no rales, rhonchi or wheeze  CV: HRRR, no peripheral edema or cyanosis, normal pedal pulses  ABDOMEN: bowel sounds normal, soft, non tender to palpation, no masses, no rebound or guarding  GU/BREAST: declined, does with gyn  SKIN: no rash or abnormal lesions, deferred full skin exam  MS: normal gait, moves all extremities normally  NEURO: normal gait, speech and thought processing grossly intact, muscle tone grossly intact throughout  PSYCH: normal affect, pleasant and cooperative  ASSESSMENT AND PLAN:  Discussed the following assessment and plan:  PREVENTIVE EXAM: -Discussed and advised all [place] preventive services health task force level A and B recommendations for age, sex and risks. -Advised at least 150 minutes of exercise per week and a healthy diet with avoidance of (less then 1 serving per week) processed foods, white starches, red meat, fast foods and sweets and consisting of: * 5-9 servings of fresh fruits and vegetables (not corn or potatoes) *nuts and seeds, beans *olives and olive oil *lean meats such as fish and white chicken  *whole grains -labs, studies and vaccines per orders this encounter  2. Hypothyroidism, unspecified type -continue current tx - TSH  3. Obesity (BMI 35.0-39.9 without comorbidity) -lifestyle recs, training for 104mle bike ride with spouse, working on det, has had wt loss - Lipid panel  4. Dyslipidemia -lifestyle recs - Lipid panel  5. MDD -reaction to stress,  situation -discussed tx options, she is doing some tx cbt at work -may consider cbt here, opted against medications for now -agrees to follow up sooner if worsening or new concerns  Patient advised to return to clinic immediately if symptoms worsen or persist or new concerns.  Patient Instructions  BEFORE YOU LEAVE: -flu vaccine -tetanus booster -follow up: 4 months  Consider counseling with KDennison Bulla  I hope you are feeling better soon! Seek care promptly if your symptoms worsen, new concerns arise or you are not improving with treatment.  See your gynecologist for your annual exam.  We have ordered labs or studies at this visit. It can take up to 1-2 weeks for results and processing. IF results require follow up or explanation, we will call you with instructions. Clinically stable results will be released to your [facility] If you have not heard from [place]  or cannot find your results in [facility] in 2 weeks please contact our office at [phone].  If you are not yet signed up for [facility], please consider signing up.    Preventive Care 40-64 Years, Female Preventive care refers to lifestyle choices and visits with your health care provider that can promote health and wellness. What does preventive care include?   A yearly physical exam. This is also called an annual well check.  Dental exams once or twice a year.  Routine eye exams. Ask your health care provider how often you should have your eyes checked.  Personal lifestyle choices, including: ? Daily care of your teeth and gums. ? Regular physical activity. ? Eating a healthy diet. ? Avoiding tobacco and drug use. ? Limiting alcohol use. ? Practicing safe sex. ? Taking vitamin and mineral supplements as recommended by your health care provider. What happens during an annual well check? The services and screenings done by your health care provider during your annual well check will depend on your age, overall health,  lifestyle risk factors, and family history of disease. Counseling Your health care provider may ask you questions about your:  Alcohol use.  Tobacco use.  Drug use.  Emotional well-being.  Home and relationship well-being.  Sexual activity.  Eating habits.  Work and work Statistician.  Method of birth control.  Menstrual cycle.  Pregnancy history. Screening You may have the following tests or measurements:  Height, weight, and BMI.  Blood pressure.  Lipid and cholesterol levels. These may be checked every 5 years, or more frequently if you are over 46 years old.  Skin check.  Lung cancer screening. You may have this screening every year starting at age 81 if you have a 30-pack-year history of smoking and currently smoke or have quit within the past 15 years.  Colorectal cancer screening. All adults should have this screening starting at age 25 and continuing until age 63. Your health care provider may recommend screening at age 74. You will have tests every 1-10 years, depending on your results and the type of screening test. People at increased risk should start screening at an earlier age. Screening tests may include: ? Guaiac-based fecal occult blood testing. ? Fecal immunochemical test (FIT). ? Stool DNA test. ? Virtual colonoscopy. ? Sigmoidoscopy. During this test, a flexible tube with a tiny camera (sigmoidoscope) is used to examine your rectum and lower colon. The sigmoidoscope is inserted through your anus into your rectum and lower colon. ? Colonoscopy. During this test, a long, thin, flexible tube with a tiny camera (colonoscope) is used to examine your entire colon and rectum.  Hepatitis C blood test.  Hepatitis B blood test.  Sexually transmitted disease (STD) testing.  Diabetes screening. This is done by checking your blood sugar (glucose) after you have not eaten for a while (fasting). You may have this done every 1-3 years.  Mammogram. This may be  done every 1-2 years. Talk to your health care provider about when you should start having regular mammograms. This may depend on whether you have a family history of breast cancer.  BRCA-related cancer screening. This may be done if you have a family history of breast, ovarian, tubal, or peritoneal cancers.  Pelvic exam and Pap test. This may be done every 3 years starting at age 74. Starting at age 46, this may be done every 5 years if you have a Pap test in combination with an HPV test.  Bone density scan. This  is done to screen for osteoporosis. You may have this scan if you are at high risk for osteoporosis. Discuss your test results, treatment options, and if necessary, the need for more tests with your health care provider. Vaccines Your health care provider may recommend certain vaccines, such as:  Influenza vaccine. This is recommended every year.  Tetanus, diphtheria, and acellular pertussis (Tdap, Td) vaccine. You may need a Td booster every 10 years.  Varicella vaccine. You may need this if you have not been vaccinated.  Zoster vaccine. You may need this after age 42.  Measles, mumps, and rubella (MMR) vaccine. You may need at least one dose of MMR if you were born in 1957 or later. You may also need a second dose.  Pneumococcal 13-valent conjugate (PCV13) vaccine. You may need this if you have certain conditions and were not previously vaccinated.  Pneumococcal polysaccharide (PPSV23) vaccine. You may need one or two doses if you smoke cigarettes or if you have certain conditions.  Meningococcal vaccine. You may need this if you have certain conditions.  Hepatitis A vaccine. You may need this if you have certain conditions or if you travel or work in places where you may be exposed to hepatitis A.  Hepatitis B vaccine. You may need this if you have certain conditions or if you travel or work in places where you may be exposed to hepatitis B.  Haemophilus influenzae type b  (Hib) vaccine. You may need this if you have certain conditions. Talk to your health care provider about which screenings and vaccines you need and how often you need them. This information is not intended to replace advice given to you by your health care provider. Make sure you discuss any questions you have with your health care provider. Document Released: 01/28/2015 Document Revised: 02/21/2017 Document Reviewed: 11/02/2014 Elsevier Interactive Patient Education  2019 [employer].          No follow-ups on file.  Lucretia Kern, DO

## 2018-02-18 ENCOUNTER — Encounter: Payer: Self-pay | Admitting: Family Medicine

## 2018-02-18 ENCOUNTER — Ambulatory Visit (INDEPENDENT_AMBULATORY_CARE_PROVIDER_SITE_OTHER): Payer: 59 | Admitting: Family Medicine

## 2018-02-18 VITALS — BP 112/62 | HR 71 | Temp 98.2°F | Ht 60.75 in | Wt 202.7 lb

## 2018-02-18 DIAGNOSIS — Z23 Encounter for immunization: Secondary | ICD-10-CM | POA: Diagnosis not present

## 2018-02-18 DIAGNOSIS — E669 Obesity, unspecified: Secondary | ICD-10-CM

## 2018-02-18 DIAGNOSIS — Z0000 Encounter for general adult medical examination without abnormal findings: Secondary | ICD-10-CM | POA: Diagnosis not present

## 2018-02-18 DIAGNOSIS — E785 Hyperlipidemia, unspecified: Secondary | ICD-10-CM | POA: Diagnosis not present

## 2018-02-18 DIAGNOSIS — E039 Hypothyroidism, unspecified: Secondary | ICD-10-CM | POA: Diagnosis not present

## 2018-02-18 LAB — LIPID PANEL
CHOL/HDL RATIO: 5
Cholesterol: 166 mg/dL (ref 0–200)
HDL: 35.3 mg/dL — ABNORMAL LOW (ref >39.00)
LDL Cholesterol: 111 mg/dL — ABNORMAL HIGH (ref 0–99)
NONHDL: 131.02
TRIGLYCERIDES: 102 mg/dL (ref 0.0–149.0)
VLDL: 20.4 mg/dL (ref 0.0–40.0)

## 2018-02-18 LAB — TSH: TSH: 1.81 u[IU]/mL (ref 0.35–4.50)

## 2018-02-18 NOTE — Addendum Note (Signed)
Addended by: Agnes Lawrence on: 02/18/2018 09:32 AM   Modules accepted: Orders

## 2018-02-18 NOTE — Patient Instructions (Signed)
BEFORE YOU LEAVE: -flu vaccine -tetanus booster -follow up: 4 months  Consider counseling with Dennison Bulla.  I hope you are feeling better soon! Seek care promptly if your symptoms worsen, new concerns arise or you are not improving with treatment.  See your gynecologist for your annual exam.  We have ordered labs or studies at this visit. It can take up to 1-2 weeks for results and processing. IF results require follow up or explanation, we will call you with instructions. Clinically stable results will be released to your [facility]. If you have not heard from [place] or cannot find your results in [facility] in 2 weeks please contact our office at [phone].  If you are not yet signed up for [facility], please consider signing up.    Preventive Care 40-64 Years, Female Preventive care refers to lifestyle choices and visits with your health care provider that can promote health and wellness. What does preventive care include?   A yearly physical exam. This is also called an annual well check.  Dental exams once or twice a year.  Routine eye exams. Ask your health care provider how often you should have your eyes checked.  Personal lifestyle choices, including: ? Daily care of your teeth and gums. ? Regular physical activity. ? Eating a healthy diet. ? Avoiding tobacco and drug use. ? Limiting alcohol use. ? Practicing safe sex. ? Taking vitamin and mineral supplements as recommended by your health care provider. What happens during an annual well check? The services and screenings done by your health care provider during your annual well check will depend on your age, overall health, lifestyle risk factors, and family history of disease. Counseling Your health care provider may ask you questions about your:  Alcohol use.  Tobacco use.  Drug use.  Emotional well-being.  Home and relationship well-being.  Sexual activity.  Eating habits.  Work and work  Statistician.  Method of birth control.  Menstrual cycle.  Pregnancy history. Screening You may have the following tests or measurements:  Height, weight, and BMI.  Blood pressure.  Lipid and cholesterol levels. These may be checked every 5 years, or more frequently if you are over 65 years old.  Skin check.  Lung cancer screening. You may have this screening every year starting at age 24 if you have a 30-pack-year history of smoking and currently smoke or have quit within the past 15 years.  Colorectal cancer screening. All adults should have this screening starting at age 69 and continuing until age 5. Your health care provider may recommend screening at age 26. You will have tests every 1-10 years, depending on your results and the type of screening test. People at increased risk should start screening at an earlier age. Screening tests may include: ? Guaiac-based fecal occult blood testing. ? Fecal immunochemical test (FIT). ? Stool DNA test. ? Virtual colonoscopy. ? Sigmoidoscopy. During this test, a flexible tube with a tiny camera (sigmoidoscope) is used to examine your rectum and lower colon. The sigmoidoscope is inserted through your anus into your rectum and lower colon. ? Colonoscopy. During this test, a long, thin, flexible tube with a tiny camera (colonoscope) is used to examine your entire colon and rectum.  Hepatitis C blood test.  Hepatitis B blood test.  Sexually transmitted disease (STD) testing.  Diabetes screening. This is done by checking your blood sugar (glucose) after you have not eaten for a while (fasting). You may have this done every 1-3 years.  Mammogram. This  may be done every 1-2 years. Talk to your health care provider about when you should start having regular mammograms. This may depend on whether you have a family history of breast cancer.  BRCA-related cancer screening. This may be done if you have a family history of breast, ovarian, tubal,  or peritoneal cancers.  Pelvic exam and Pap test. This may be done every 3 years starting at age 52. Starting at age 12, this may be done every 5 years if you have a Pap test in combination with an HPV test.  Bone density scan. This is done to screen for osteoporosis. You may have this scan if you are at high risk for osteoporosis. Discuss your test results, treatment options, and if necessary, the need for more tests with your health care provider. Vaccines Your health care provider may recommend certain vaccines, such as:  Influenza vaccine. This is recommended every year.  Tetanus, diphtheria, and acellular pertussis (Tdap, Td) vaccine. You may need a Td booster every 10 years.  Varicella vaccine. You may need this if you have not been vaccinated.  Zoster vaccine. You may need this after age 81.  Measles, mumps, and rubella (MMR) vaccine. You may need at least one dose of MMR if you were born in 1957 or later. You may also need a second dose.  Pneumococcal 13-valent conjugate (PCV13) vaccine. You may need this if you have certain conditions and were not previously vaccinated.  Pneumococcal polysaccharide (PPSV23) vaccine. You may need one or two doses if you smoke cigarettes or if you have certain conditions.  Meningococcal vaccine. You may need this if you have certain conditions.  Hepatitis A vaccine. You may need this if you have certain conditions or if you travel or work in places where you may be exposed to hepatitis A.  Hepatitis B vaccine. You may need this if you have certain conditions or if you travel or work in places where you may be exposed to hepatitis B.  Haemophilus influenzae type b (Hib) vaccine. You may need this if you have certain conditions. Talk to your health care provider about which screenings and vaccines you need and how often you need them. This information is not intended to replace advice given to you by your health care provider. Make sure you  discuss any questions you have with your health care provider. Document Released: 01/28/2015 Document Revised: 02/21/2017 Document Reviewed: 11/02/2014 Elsevier Interactive Patient Education  2019 [employer].

## 2018-05-01 ENCOUNTER — Other Ambulatory Visit: Payer: Self-pay | Admitting: Family Medicine

## 2018-07-02 ENCOUNTER — Telehealth: Payer: Self-pay | Admitting: *Deleted

## 2018-07-02 ENCOUNTER — Other Ambulatory Visit: Payer: Self-pay

## 2018-07-02 ENCOUNTER — Ambulatory Visit (INDEPENDENT_AMBULATORY_CARE_PROVIDER_SITE_OTHER): Payer: 59 | Admitting: Internal Medicine

## 2018-07-02 ENCOUNTER — Encounter: Payer: Self-pay | Admitting: Internal Medicine

## 2018-07-02 VITALS — BP 118/70 | HR 73 | Temp 98.6°F | Wt 209.2 lb

## 2018-07-02 DIAGNOSIS — B372 Candidiasis of skin and nail: Secondary | ICD-10-CM

## 2018-07-02 DIAGNOSIS — E039 Hypothyroidism, unspecified: Secondary | ICD-10-CM

## 2018-07-02 DIAGNOSIS — L237 Allergic contact dermatitis due to plants, except food: Secondary | ICD-10-CM

## 2018-07-02 MED ORDER — METHYLPREDNISOLONE ACETATE 80 MG/ML IJ SUSP: 80.0000 mg | Freq: Once | INTRAMUSCULAR | Status: DC

## 2018-07-02 MED ORDER — METHYLPREDNISOLONE ACETATE 80 MG/ML IJ SUSP
80.0000 mg | Freq: Once | INTRAMUSCULAR | Status: AC
  Administered 2018-07-02: 80 mg via INTRAMUSCULAR

## 2018-07-02 MED ORDER — NYSTATIN 100000 UNIT/GM EX POWD: Freq: Two times a day (BID) | CUTANEOUS | 3 refills | Status: DC

## 2018-07-02 MED ORDER — LEVOTHYROXINE SODIUM 100 MCG PO TABS: ORAL_TABLET | ORAL | 1 refills | Status: DC

## 2018-07-02 NOTE — Addendum Note (Signed)
Addended by: Westley Hummer B on: 07/02/2018 02:04 PM   Modules accepted: Orders

## 2018-07-02 NOTE — Patient Instructions (Signed)
-  Nice meeting you today!  -Take 6 days of prednisone in a taper as instructed.  -Steroid shot in office today.  -After cleaning and patting dry, apply nystatin powder twice a day to groin and under breast area.   Poison Ivy Dermatitis  Poison ivy dermatitis is redness and soreness (inflammation) of the skin. It is caused by a chemical that is found on the leaves of the poison ivy plant. You may also have itching, a rash, and blisters. Symptoms often clear up in 1-2 weeks. You may get this condition by touching a poison ivy plant. You can also get it by touching something that has the chemical on it. This may include animals or objects that have come in contact with the plant. Follow these instructions at home: General instructions  Take or apply over-the-counter and prescription medicines only as told by your doctor.  If you touch poison ivy, wash your skin with soap and cold water right away.  Use hydrocortisone creams or calamine lotion as needed to help with itching.  Take oatmeal baths as needed. Use colloidal oatmeal. You can get this at a pharmacy or grocery store. Follow the instructions on the package.  Do not scratch or rub your skin.  While you have the rash, wash your clothes right after you wear them. Prevention   Know what poison ivy looks like so you can avoid it. This plant has three leaves with flowering branches on a single stem. The leaves are glossy. They have uneven edges that come to a point at the front.  If you have touched poison ivy, wash with soap and water right away. Be sure to wash under your fingernails.  When hiking or camping, wear long pants, a long-sleeved shirt, tall socks, and hiking boots. You can also use a lotion on your skin that helps to prevent contact with the chemical on the plant.  If you think that your clothes or outdoor gear came in contact with poison ivy, rinse them off with a garden hose before you bring them inside your house.  Contact a doctor if:  You have open sores in the rash area.  You have more redness, swelling, or pain in the affected area.  You have redness that spreads beyond the rash area.  You have fluid, blood, or pus coming from the affected area.  You have a fever.  You have a rash over a large area of your body.  You have a rash on your eyes, mouth, or genitals.  Your rash does not get better after a few days. Get help right away if:  Your face swells or your eyes swell shut.  You have trouble breathing.  You have trouble swallowing. This information is not intended to replace advice given to you by your health care provider. Make sure you discuss any questions you have with your health care provider. Document Released: 02/03/2010 Document Revised: 09/25/2017 Document Reviewed: 06/09/2014 Elsevier Interactive Patient Education  2019 [employer].

## 2018-07-02 NOTE — Telephone Encounter (Signed)
I left a detailed message for the pt to call the office for an appt and evaluation by a provider.

## 2018-07-02 NOTE — Progress Notes (Signed)
Acute Office Visit     CC/Reason for Visit: Itchy rash after poison ivy exposure  HPI: Kelli Ferrell is a 48 y.o. female who is coming in today for the above mentioned reasons. 2 weeks ago she was pulling weeds in the yard and was exposed to significant amounts of poison ivy. Has a rash in several exposed areas including fingers, arms, chest and ear that started as small blisters but are now excoriated in a linear fashion. It is very itchy. In addition she shows me a rash in both groin creases and under both breasts that she also believes is poison ivy. In these last 2 locations it is red and slimy, worse when sweating or working out. She needs synthroid refills.  [facility] 14 day prednisone taper yesterday that she has not started yet. She has also been using topical meds like benadryl and calamine.   Past Medical/Surgical History: Past Medical History:  Diagnosis Date  . Blood in stool   . Dyslipidemia 11/18/2014  . Hypothyroidism   . Kidney stones   . Morbid obesity ([place]) 11/18/2014  . 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 Seasonal allergies 11/22/2015  . Urinary tract infection     Past Surgical History:  Procedure Laterality Date  . DILATION AND CURETTAGE OF UTERUS    . UTERINE FIBROID EMBOLIZATION      Social History:  reports that she has never smoked. She has never used smokeless tobacco. She reports current alcohol use. She reports that she does not use drugs.  Allergies: No Known Allergies  Family History:  Family History  Problem Relation Age of Onset  . Diabetes Mother   . Stroke Mother   . Hypertension Mother   . Diabetes Father   . Diabetes Brother      Current Outpatient Medications:  .  fluticasone (FLONASE) 50 MCG/ACT nasal  spray, Place into both nostrils daily., Disp: , Rfl:  .  levothyroxine (SYNTHROID) 100 MCG tablet, TAKE 1 TABLET DAILY BEFORE BREAKFAST, Disp: 90 tablet, Rfl: 1 .  pantoprazole (PROTONIX) 40 MG tablet, Take 1 tablet (40 mg total) by mouth daily., Disp: 30 tablet, Rfl: 3 .  nystatin (MYCOSTATIN/NYSTOP) powder, Apply topically 2 (two) times daily., Disp: 15 g, Rfl: 3  Current Facility-Administered Medications:  .  methylPREDNISolone acetate (DEPO-MEDROL) injection 80 mg, 80 mg, Intramuscular, Once, Isaac Bliss, Rayford Halsted, MD  Review of Systems:  Constitutional: Denies fever, chills, diaphoresis, appetite change and fatigue.  HEENT: Denies photophobia, eye pain, redness, hearing loss, ear pain, congestion, sore throat, rhinorrhea, sneezing, mouth sores, trouble swallowing, neck pain, neck stiffness and tinnitus.   Respiratory: Denies SOB, DOE, cough, chest tightness,  and wheezing.   Cardiovascular: Denies chest pain, palpitations and leg swelling.  Gastrointestinal: Denies nausea, vomiting, abdominal pain, diarrhea, constipation, blood in stool and abdominal distention.  Genitourinary: Denies dysuria, urgency, frequency, hematuria, flank pain and difficulty urinating.  Endocrine: Denies: hot or cold intolerance, sweats, changes in hair or nails, polyuria, polydipsia. Musculoskeletal: Denies myalgias, back pain, joint swelling, arthralgias and gait problem.  Skin: Rash as described above.  Neurological: Denies dizziness, seizures, syncope, weakness, light-headedness, numbness and headaches.  Hematological: Denies adenopathy. Easy bruising, personal or family bleeding history  Psychiatric/Behavioral: Denies suicidal ideation, mood changes,  confusion, nervousness, sleep disturbance and agitation    Physical Exam: Vitals:   07/02/18 1327  BP: 118/70  Pulse: 73  Temp: 98.6 F (37 C)  TempSrc: Oral  SpO2: 98%  Weight: 209 lb 3.2 oz (94.9 kg)    Body mass index is 39.85 kg/m.    Constitutional: NAD, calm, comfortable Eyes: PERRL, lids and conjunctivae normal ENMT: Mucous membranes are moist.  Skin: Rash as described above in HPI Neurologic: grossly intact and nonfocal Psychiatric: Normal judgment and insight. Alert and oriented x 3. Normal mood.    Impression and Plan:  Poison ivy dermatitis  -Depomedrol in office today. -Do 6 instead of 14 day prednisone taper starting at 60 mg tomorrow.  Yeast dermatitis  -Nystatin powder to under breasts and to groin creases BID after washing and patting dry. -no reusing of bra and clothes without washing first.  Hypothyroidism, unspecified type  -Will refill meds today.    Patient Instructions  -Nice meeting you today!  -Take 6 days of prednisone in a taper as instructed.  -Steroid shot in office today.  -After cleaning and patting dry, apply nystatin powder twice a day to groin and under breast area.   Poison Ivy Dermatitis  Poison ivy dermatitis is redness and soreness (inflammation) of the skin. It is caused by a chemical that is found on the leaves of the poison ivy plant. You may also have itching, a rash, and blisters. Symptoms often clear up in 1-2 weeks. You may get this condition by touching a poison ivy plant. You can also get it by touching something that has the chemical on it. This may include animals or objects that have come in contact with the plant. Follow these instructions at home: General instructions  Take or apply over-the-counter and prescription medicines only as told by your doctor.  If you touch poison ivy, wash your skin with soap and cold water right away.  Use hydrocortisone creams or calamine lotion as needed to help with itching.  Take oatmeal baths as needed. Use colloidal oatmeal. You can get this at a pharmacy or grocery store. Follow the instructions on the package.  Do not scratch or rub your skin.  While you have the rash, wash your clothes right after you wear them.  Prevention   Know what poison ivy looks like so you can avoid it. This plant has three leaves with flowering branches on a single stem. The leaves are glossy. They have uneven edges that come to a point at the front.  If you have touched poison ivy, wash with soap and water right away. Be sure to wash under your fingernails.  When hiking or camping, wear long pants, a long-sleeved shirt, tall socks, and hiking boots. You can also use a lotion on your skin that helps to prevent contact with the chemical on the plant.  If you think that your clothes or outdoor gear came in contact with poison ivy, rinse them off with a garden hose before you bring them inside your house. Contact a doctor if:  You have open sores in the rash area.  You have more redness, swelling, or pain in the affected area.  You have redness that spreads beyond the rash area.  You have fluid, blood, or pus coming from the affected area.  You have a fever.  You have a rash over a large area of your body.  You have a rash on your eyes, mouth, or genitals.  Your rash does not  get better after a few days. Get help right away if:  Your face swells or your eyes swell shut.  You have trouble breathing.  You have trouble swallowing. This information is not intended to replace advice given to you by your health care provider. Make sure you discuss any questions you have with your health care provider. Document Released: 02/03/2010 Document Revised: 09/25/2017 Document Reviewed: 06/09/2014 Elsevier Interactive Patient Education  2019 [place], [place] [facility]

## 2018-07-02 NOTE — Telephone Encounter (Signed)
Copied from [place]es [id number]. Topic: General - Inquiry >> Jul 02, 2018  8:44 AM Richardo Priest, NT wrote: Reason for CRM: Patient calling in stating that she is having poison ivy all over her body. States she would like an injection if possible. Please advise and call back is [phone].

## 2018-12-31 ENCOUNTER — Other Ambulatory Visit: Payer: 59

## 2019-01-08 ENCOUNTER — Ambulatory Visit: Payer: 59 | Attending: Internal Medicine

## 2019-01-08 DIAGNOSIS — Z20822 Contact with and (suspected) exposure to covid-19: Secondary | ICD-10-CM

## 2019-01-09 LAB — NOVEL CORONAVIRUS, NAA: SARS-CoV-2, NAA: DETECTED — AB

## 2019-01-16 HISTORY — PX: DILATION AND CURETTAGE OF UTERUS: SHX78

## 2019-02-19 ENCOUNTER — Encounter: Payer: 59 | Admitting: Internal Medicine

## 2019-04-15 ENCOUNTER — Ambulatory Visit (INDEPENDENT_AMBULATORY_CARE_PROVIDER_SITE_OTHER): Payer: 59 | Admitting: Internal Medicine

## 2019-04-15 ENCOUNTER — Encounter: Payer: Self-pay | Admitting: Internal Medicine

## 2019-04-15 VITALS — BP 118/74 | HR 74 | Temp 97.2°F | Ht 61.0 in | Wt 209.9 lb

## 2019-04-15 DIAGNOSIS — E785 Hyperlipidemia, unspecified: Secondary | ICD-10-CM

## 2019-04-15 DIAGNOSIS — Z1211 Encounter for screening for malignant neoplasm of colon: Secondary | ICD-10-CM | POA: Diagnosis not present

## 2019-04-15 DIAGNOSIS — K219 Gastro-esophageal reflux disease without esophagitis: Secondary | ICD-10-CM

## 2019-04-15 DIAGNOSIS — Z1231 Encounter for screening mammogram for malignant neoplasm of breast: Secondary | ICD-10-CM

## 2019-04-15 DIAGNOSIS — Z0000 Encounter for general adult medical examination without abnormal findings: Secondary | ICD-10-CM | POA: Diagnosis not present

## 2019-04-15 DIAGNOSIS — E039 Hypothyroidism, unspecified: Secondary | ICD-10-CM

## 2019-04-15 DIAGNOSIS — E669 Obesity, unspecified: Secondary | ICD-10-CM | POA: Diagnosis not present

## 2019-04-15 LAB — VITAMIN D 25 HYDROXY (VIT D DEFICIENCY, FRACTURES): VITD: 35.51 ng/mL (ref 30.00–100.00)

## 2019-04-15 LAB — CBC WITH DIFFERENTIAL/PLATELET
Basophils Absolute: 0 10*3/uL (ref 0.0–0.1)
Basophils Relative: 0.7 % (ref 0.0–3.0)
Eosinophils Absolute: 0.2 10*3/uL (ref 0.0–0.7)
Eosinophils Relative: 2.5 % (ref 0.0–5.0)
HCT: 37 % (ref 36.0–46.0)
Hemoglobin: 12.3 g/dL (ref 12.0–15.0)
Lymphocytes Relative: 22.7 % (ref 12.0–46.0)
Lymphs Abs: 1.4 10*3/uL (ref 0.7–4.0)
MCHC: 33.2 g/dL (ref 30.0–36.0)
MCV: 85 fl (ref 78.0–100.0)
Monocytes Absolute: 0.5 10*3/uL (ref 0.1–1.0)
Monocytes Relative: 7.8 % (ref 3.0–12.0)
Neutro Abs: 4 10*3/uL (ref 1.4–7.7)
Neutrophils Relative %: 66.3 % (ref 43.0–77.0)
Platelets: 377 10*3/uL (ref 150.0–400.0)
RBC: 4.36 Mil/uL (ref 3.87–5.11)
RDW: 14.7 % (ref 11.5–15.5)
WBC: 6 10*3/uL (ref 4.0–10.5)

## 2019-04-15 LAB — LIPID PANEL
Cholesterol: 183 mg/dL (ref 0–200)
HDL: 35.9 mg/dL — ABNORMAL LOW (ref >39.00)
LDL Cholesterol: 114 mg/dL — ABNORMAL HIGH (ref 0–99)
NonHDL: 146.77
Total CHOL/HDL Ratio: 5
Triglycerides: 165 mg/dL — ABNORMAL HIGH (ref 0.0–149.0)
VLDL: 33 mg/dL (ref 0.0–40.0)

## 2019-04-15 LAB — COMPREHENSIVE METABOLIC PANEL
ALT: 12 U/L (ref 0–35)
AST: 12 U/L (ref 0–37)
Albumin: 3.7 g/dL (ref 3.5–5.2)
Alkaline Phosphatase: 52 U/L (ref 39–117)
BUN: 10 mg/dL (ref 6–23)
CO2: 27 mEq/L (ref 19–32)
Calcium: 9 mg/dL (ref 8.4–10.5)
Chloride: 104 mEq/L (ref 96–112)
Creatinine, Ser: 0.76 mg/dL (ref 0.40–1.20)
GFR: 81.24 mL/min (ref >60.00)
Glucose, Bld: 97 mg/dL (ref 70–99)
Potassium: 4.6 mEq/L (ref 3.5–5.1)
Sodium: 137 mEq/L (ref 135–145)
Total Bilirubin: 0.4 mg/dL (ref 0.2–1.2)
Total Protein: 6.1 g/dL (ref 6.0–8.3)

## 2019-04-15 LAB — HEMOGLOBIN A1C: Hgb A1c MFr Bld: 5.2 % (ref 4.6–6.5)

## 2019-04-15 LAB — TSH: TSH: 3.28 u[IU]/mL (ref 0.35–4.50)

## 2019-04-15 LAB — VITAMIN B12: Vitamin B-12: 1391 pg/mL — ABNORMAL HIGH (ref 211–911)

## 2019-04-15 MED ORDER — PANTOPRAZOLE SODIUM 40 MG PO TBEC: 40.0000 mg | DELAYED_RELEASE_TABLET | Freq: Every day | ORAL | 1 refills | Status: DC

## 2019-04-15 NOTE — Addendum Note (Signed)
Addended by: Suzette Battiest on: 04/15/2019 11:58 AM   Modules accepted: Orders

## 2019-04-15 NOTE — Patient Instructions (Signed)
-Nice seeing you today!!  -Lab work today; will notify you once results are available.  -Start protonix 40 mg daily for 3 months.  -See you back in 6 months or sooner if needed.   Preventive Care 81-48 Years Old, Female Preventive care refers to visits with your health care provider and lifestyle choices that can promote health and wellness. This includes:  A yearly physical exam. This may also be called an annual well check.  Regular dental visits and eye exams.  Immunizations.  Screening for certain conditions.  Healthy lifestyle choices, such as eating a healthy diet, getting regular exercise, not using drugs or products that contain nicotine and tobacco, and limiting alcohol use. What can I expect for my preventive care visit? Physical exam Your health care provider will check your:  Height and weight. This may be used to calculate body mass index (BMI), which tells if you are at a healthy weight.  Heart rate and blood pressure.  Skin for abnormal spots. Counseling Your health care provider may ask you questions about your:  Alcohol, tobacco, and drug use.  Emotional well-being.  Home and relationship well-being.  Sexual activity.  Eating habits.  Work and work Statistician.  Method of birth control.  Menstrual cycle.  Pregnancy history. What immunizations do I need?  Influenza (flu) vaccine  This is recommended every year. Tetanus, diphtheria, and pertussis (Tdap) vaccine  You may need a Td booster every 10 years. Varicella (chickenpox) vaccine  You may need this if you have not been vaccinated. Zoster (shingles) vaccine  You may need this after age 80. Measles, mumps, and rubella (MMR) vaccine  You may need at least one dose of MMR if you were born in 1957 or later. You may also need a second dose. Pneumococcal conjugate (PCV13) vaccine  You may need this if you have certain conditions and were not previously vaccinated. Pneumococcal  polysaccharide (PPSV23) vaccine  You may need one or two doses if you smoke cigarettes or if you have certain conditions. Meningococcal conjugate (MenACWY) vaccine  You may need this if you have certain conditions. Hepatitis A vaccine  You may need this if you have certain conditions or if you travel or work in places where you may be exposed to hepatitis A. Hepatitis B vaccine  You may need this if you have certain conditions or if you travel or work in places where you may be exposed to hepatitis B. Haemophilus influenzae type b (Hib) vaccine  You may need this if you have certain conditions. Human papillomavirus (HPV) vaccine  If recommended by your health care provider, you may need three doses over 6 months. You may receive vaccines as individual doses or as more than one vaccine together in one shot (combination vaccines). Talk with your health care provider about the risks and benefits of combination vaccines. What tests do I need? Blood tests  Lipid and cholesterol levels. These may be checked every 5 years, or more frequently if you are over 63 years old.  Hepatitis C test.  Hepatitis B test. Screening  Lung cancer screening. You may have this screening every year starting at age 67 if you have a 30-pack-year history of smoking and currently smoke or have quit within the past 15 years.  Colorectal cancer screening. All adults should have this screening starting at age 84 and continuing until age 80. Your health care provider may recommend screening at age 59 if you are at increased risk. You will have tests every  1-10 years, depending on your results and the type of screening test.  Diabetes screening. This is done by checking your blood sugar (glucose) after you have not eaten for a while (fasting). You may have this done every 1-3 years.  Mammogram. This may be done every 1-2 years. Talk with your health care provider about when you should start having regular  mammograms. This may depend on whether you have a family history of breast cancer.  BRCA-related cancer screening. This may be done if you have a family history of breast, ovarian, tubal, or peritoneal cancers.  Pelvic exam and Pap test. This may be done every 3 years starting at age 86. Starting at age 66, this may be done every 5 years if you have a Pap test in combination with an HPV test. Other tests  Sexually transmitted disease (STD) testing.  Bone density scan. This is done to screen for osteoporosis. You may have this scan if you are at high risk for osteoporosis. Follow these instructions at home: Eating and drinking  Eat a diet that includes fresh fruits and vegetables, whole grains, lean protein, and low-fat dairy.  Take vitamin and mineral supplements as recommended by your health care provider.  Do not drink alcohol if: ? Your health care provider tells you not to drink. ? You are pregnant, may be pregnant, or are planning to become pregnant.  If you drink alcohol: ? Limit how much you have to 0-1 drink a day. ? Be aware of how much alcohol is in your drink. In the [place] one drink equals one 12 oz bottle of beer (355 mL), one 5 oz glass of wine (148 mL), or one 1 oz glass of hard liquor (44 mL). Lifestyle  Take daily care of your teeth and gums.  Stay active. Exercise for at least 30 minutes on 5 or more days each week.  Do not use any products that contain nicotine or tobacco, such as cigarettes, e-cigarettes, and chewing tobacco. If you need help quitting, ask your health care provider.  If you are sexually active, practice safe sex. Use a condom or other form of birth control (contraception) in order to prevent pregnancy and STIs (sexually transmitted infections).  If told by your health care provider, take low-dose aspirin daily starting at age 47. What's next?  Visit your health care provider once a year for a well check visit.  Ask your health care provider  how often you should have your eyes and teeth checked.  Stay up to date on all vaccines. This information is not intended to replace advice given to you by your health care provider. Make sure you discuss any questions you have with your health care provider. Document Revised: 09/12/2017 Document Reviewed: 09/12/2017 Elsevier Patient Education  2020 [employer].

## 2019-04-15 NOTE — Progress Notes (Signed)
Established Patient Office Visit     This visit occurred during the SARS-CoV-2 public health emergency.  Safety protocols were in place, including screening questions prior to the visit, additional usage of staff PPE, and extensive cleaning of exam room while observing appropriate contact time as indicated for disinfecting solutions.    CC/Reason for Visit: Annual preventive exam  HPI: Kelli Ferrell is a 49 y.o. female who is coming in today for the above mentioned reasons. Past Medical History is significant for: Obesity with a BMI of 39, GERD, hypothyroidism on levothyroxine.  She has been having almost daily heartburn, has only been using Tums as needed.  Other than this has no acute issues.  She has routine eye and dental care.  She goes to the gym twice a week and on alternate days will walk for 30 minutes.  She is due for screening colonoscopy.  She has a GYN who does her Pap smears and records from mammograms.   Past Medical/Surgical History: Past Medical History:  Diagnosis Date  . Blood in stool   . Dyslipidemia 11/18/2014  . Hypothyroidism   . Kidney stones   . Morbid obesity ([place]) 11/18/2014  . 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 Seasonal allergies 11/22/2015  . Urinary tract infection     Past Surgical History:  Procedure Laterality Date  . DILATION AND CURETTAGE OF UTERUS    . UTERINE FIBROID EMBOLIZATION      Social History:  reports that she has never smoked. She has never used smokeless tobacco. She reports current alcohol use. She reports that she does not use drugs.  Allergies: No Known Allergies  Family History:  Family History  Problem Relation Age of Onset  . Diabetes Mother   . Stroke Mother   . Hypertension Mother   . Diabetes Father   . Diabetes Brother        Current Outpatient Medications:  .  fluticasone (FLONASE) 50 MCG/ACT nasal spray, Place into both nostrils daily., Disp: , Rfl:  .  levothyroxine (SYNTHROID) 100 MCG tablet, TAKE 1 TABLET DAILY BEFORE BREAKFAST, Disp: 90 tablet, Rfl: 1 .  nystatin (MYCOSTATIN/NYSTOP) powder, Apply topically 2 (two) times daily., Disp: 15 g, Rfl: 3 .  pantoprazole (PROTONIX) 40 MG tablet, Take 1 tablet (40 mg total) by mouth daily., Disp: 90 tablet, Rfl: 1  Review of Systems:  Constitutional: Denies fever, chills, diaphoresis, appetite change and fatigue.  HEENT: Denies photophobia, eye pain, redness, hearing loss, ear pain, congestion, sore throat, rhinorrhea, sneezing, mouth sores, trouble swallowing, neck pain, neck stiffness and tinnitus.   Respiratory: Denies SOB, DOE, cough, chest tightness,  and wheezing.   Cardiovascular: Denies chest pain, palpitations and leg swelling.  Gastrointestinal: Denies nausea, vomiting, abdominal pain, diarrhea, constipation, blood in stool and abdominal distention.  Genitourinary: Denies dysuria, urgency, frequency, hematuria, flank pain and difficulty urinating.  Endocrine: Denies: hot or cold intolerance, sweats, changes in hair or nails, polyuria, polydipsia. Musculoskeletal: Denies myalgias, back pain, joint swelling, arthralgias and gait problem.  Skin: Denies pallor, rash and wound.  Neurological: Denies dizziness, seizures, syncope, weakness, light-headedness, numbness and headaches.  Hematological: Denies adenopathy. Easy bruising, personal or family bleeding history  Psychiatric/Behavioral: Denies suicidal ideation, mood changes, confusion, nervousness, sleep disturbance and agitation    Physical Exam: Vitals:  04/15/19 1113  BP: 118/74  Pulse: 74  Temp: (!) 97.2 F (36.2 C)  TempSrc: Temporal  SpO2: 95%  Weight: 209 lb 14.4 oz (95.2 kg)  Height: 5' 1"  (1.549 m)    Body mass index is 39.66 kg/m.   Constitutional: NAD, calm,  comfortable Eyes: PERRL, lids and conjunctivae normal ENMT: Mucous membranes are moist.  Tympanic membrane is pearly white, no erythema or bulging. Neck: normal, supple, no masses, no thyromegaly Respiratory: clear to auscultation bilaterally, no wheezing, no crackles. Normal respiratory effort. No accessory muscle use.  Cardiovascular: Regular rate and rhythm, no murmurs / rubs / gallops. No extremity edema. 2+ pedal pulses. No carotid bruits.  Abdomen: no tenderness, no masses palpated. No hepatosplenomegaly. Bowel sounds positive.  Musculoskeletal: no clubbing / cyanosis. No joint deformity upper and lower extremities. Good ROM, no contractures. Normal muscle tone.  Skin: no rashes, lesions, ulcers. No induration Neurologic: CN 2-12 grossly intact. Sensation intact, DTR normal. Strength 5/5 in all 4.  Psychiatric: Normal judgment and insight. Alert and oriented x 3. Normal mood.    Impression and Plan:  Encounter for preventive health examination  -She has routine eye and dental care. -Immunizations are up-to-date, she is scheduling her Covid vaccine. -Screening labs today. -Healthy lifestyle has been discussed in detail. -Initiate referral to GI for screening colonoscopy. -Mammogram was normal in July 2020. -Pap smears are done by her GYN.  Screening for malignant neoplasm of colon -Referral to GI.  Dyslipidemia  -Check lipids, last LDL was 111 in 2020, she is not currently on statin  Hypothyroidism, unspecified type  - Plan: TSH -For now continue current Synthroid dose.  Obesity (BMI 35.0-39.9 without comorbidity) -Discussed healthy lifestyle, including increased physical activity and better food choices to promote weight loss.  Gastroesophageal reflux disease without esophagitis  -Start a trial of daily PPI therapy, Protonix 40 mg daily.   Patient Instructions  -Nice seeing you today!!  -Lab work today; will notify you once results are available.  -Start protonix  40 mg daily for 3 months.  -See you back in 6 months or sooner if needed.   Preventive Care 105-45 Years Old, Female Preventive care refers to visits with your health care provider and lifestyle choices that can promote health and wellness. This includes:  A yearly physical exam. This may also be called an annual well check.  Regular dental visits and eye exams.  Immunizations.  Screening for certain conditions.  Healthy lifestyle choices, such as eating a healthy diet, getting regular exercise, not using drugs or products that contain nicotine and tobacco, and limiting alcohol use. What can I expect for my preventive care visit? Physical exam Your health care provider will check your:  Height and weight. This may be used to calculate body mass index (BMI), which tells if you are at a healthy weight.  Heart rate and blood pressure.  Skin for abnormal spots. Counseling Your health care provider may ask you questions about your:  Alcohol, tobacco, and drug use.  Emotional well-being.  Home and relationship well-being.  Sexual activity.  Eating habits.  Work and work Statistician.  Method of birth control.  Menstrual cycle.  Pregnancy history. What immunizations do I need?  Influenza (flu) vaccine  This is recommended every year. Tetanus, diphtheria, and pertussis (Tdap) vaccine  You may need a Td booster every 10 years. Varicella (chickenpox) vaccine  You may need this if you have not been vaccinated. Zoster (shingles) vaccine  You may need this after  age 50. Measles, mumps, and rubella (MMR) vaccine  You may need at least one dose of MMR if you were born in 1957 or later. You may also need a second dose. Pneumococcal conjugate (PCV13) vaccine  You may need this if you have certain conditions and were not previously vaccinated. Pneumococcal polysaccharide (PPSV23) vaccine  You may need one or two doses if you smoke cigarettes or if you have certain  conditions. Meningococcal conjugate (MenACWY) vaccine  You may need this if you have certain conditions. Hepatitis A vaccine  You may need this if you have certain conditions or if you travel or work in places where you may be exposed to hepatitis A. Hepatitis B vaccine  You may need this if you have certain conditions or if you travel or work in places where you may be exposed to hepatitis B. Haemophilus influenzae type b (Hib) vaccine  You may need this if you have certain conditions. Human papillomavirus (HPV) vaccine  If recommended by your health care provider, you may need three doses over 6 months. You may receive vaccines as individual doses or as more than one vaccine together in one shot (combination vaccines). Talk with your health care provider about the risks and benefits of combination vaccines. What tests do I need? Blood tests  Lipid and cholesterol levels. These may be checked every 5 years, or more frequently if you are over 75 years old.  Hepatitis C test.  Hepatitis B test. Screening  Lung cancer screening. You may have this screening every year starting at age 50 if you have a 30-pack-year history of smoking and currently smoke or have quit within the past 15 years.  Colorectal cancer screening. All adults should have this screening starting at age 69 and continuing until age 90. Your health care provider may recommend screening at age 12 if you are at increased risk. You will have tests every 1-10 years, depending on your results and the type of screening test.  Diabetes screening. This is done by checking your blood sugar (glucose) after you have not eaten for a while (fasting). You may have this done every 1-3 years.  Mammogram. This may be done every 1-2 years. Talk with your health care provider about when you should start having regular mammograms. This may depend on whether you have a family history of breast cancer.  BRCA-related cancer screening. This  may be done if you have a family history of breast, ovarian, tubal, or peritoneal cancers.  Pelvic exam and Pap test. This may be done every 3 years starting at age 40. Starting at age 39, this may be done every 5 years if you have a Pap test in combination with an HPV test. Other tests  Sexually transmitted disease (STD) testing.  Bone density scan. This is done to screen for osteoporosis. You may have this scan if you are at high risk for osteoporosis. Follow these instructions at home: Eating and drinking  Eat a diet that includes fresh fruits and vegetables, whole grains, lean protein, and low-fat dairy.  Take vitamin and mineral supplements as recommended by your health care provider.  Do not drink alcohol if: ? Your health care provider tells you not to drink. ? You are pregnant, may be pregnant, or are planning to become pregnant.  If you drink alcohol: ? Limit how much you have to 0-1 drink a day. ? Be aware of how much alcohol is in your drink. In the [place] one drink equals one  12 oz bottle of beer (355 mL), one 5 oz glass of wine (148 mL), or one 1 oz glass of hard liquor (44 mL). Lifestyle  Take daily care of your teeth and gums.  Stay active. Exercise for at least 30 minutes on 5 or more days each week.  Do not use any products that contain nicotine or tobacco, such as cigarettes, e-cigarettes, and chewing tobacco. If you need help quitting, ask your health care provider.  If you are sexually active, practice safe sex. Use a condom or other form of birth control (contraception) in order to prevent pregnancy and STIs (sexually transmitted infections).  If told by your health care provider, take low-dose aspirin daily starting at age 82. What's next?  Visit your health care provider once a year for a well check visit.  Ask your health care provider how often you should have your eyes and teeth checked.  Stay up to date on all vaccines. This information is not  intended to replace advice given to you by your health care provider. Make sure you discuss any questions you have with your health care provider. Document Revised: 09/12/2017 Document Reviewed: 09/12/2017 Elsevier Patient Education  2020 [place], [place] [facility]

## 2019-04-16 HISTORY — PX: COLONOSCOPY: SHX174

## 2019-04-27 ENCOUNTER — Other Ambulatory Visit: Payer: Self-pay | Admitting: Family Medicine

## 2019-04-27 ENCOUNTER — Encounter: Payer: Self-pay | Admitting: Gastroenterology

## 2019-04-27 DIAGNOSIS — E039 Hypothyroidism, unspecified: Secondary | ICD-10-CM

## 2019-05-01 ENCOUNTER — Other Ambulatory Visit: Payer: Self-pay | Admitting: Obstetrics and Gynecology

## 2019-05-01 DIAGNOSIS — Z1231 Encounter for screening mammogram for malignant neoplasm of breast: Secondary | ICD-10-CM

## 2019-05-06 DIAGNOSIS — R102 Pelvic and perineal pain: Secondary | ICD-10-CM | POA: Insufficient documentation

## 2019-05-06 DIAGNOSIS — D259 Leiomyoma of uterus, unspecified: Secondary | ICD-10-CM | POA: Insufficient documentation

## 2019-05-06 DIAGNOSIS — R928 Other abnormal and inconclusive findings on diagnostic imaging of breast: Secondary | ICD-10-CM | POA: Insufficient documentation

## 2019-05-08 ENCOUNTER — Other Ambulatory Visit: Payer: Self-pay

## 2019-05-08 ENCOUNTER — Ambulatory Visit
Admission: RE | Admit: 2019-05-08 | Discharge: 2019-05-08 | Disposition: A | Discharge status: Home / Self Care | Payer: 59 | Source: Ambulatory Visit | Attending: Obstetrics and Gynecology | Admitting: Obstetrics and Gynecology

## 2019-05-08 DIAGNOSIS — Z1231 Encounter for screening mammogram for malignant neoplasm of breast: Secondary | ICD-10-CM

## 2019-05-14 ENCOUNTER — Other Ambulatory Visit: Payer: Self-pay

## 2019-05-14 ENCOUNTER — Ambulatory Visit (AMBULATORY_SURGERY_CENTER): Payer: Self-pay | Admitting: *Deleted

## 2019-05-14 VITALS — Temp 97.5°F | Ht 61.0 in | Wt 212.0 lb

## 2019-05-14 DIAGNOSIS — Z01818 Encounter for other preprocedural examination: Secondary | ICD-10-CM

## 2019-05-14 DIAGNOSIS — Z1211 Encounter for screening for malignant neoplasm of colon: Secondary | ICD-10-CM

## 2019-05-14 MED ORDER — SUTAB 1479-225-188 MG PO TABS: 1.0000 | ORAL_TABLET | ORAL | 0 refills | Status: DC

## 2019-05-14 NOTE — Progress Notes (Signed)
Patient is here in-person for PV. Patient denies any allergies to eggs or soy. Patient denies any problems with anesthesia/sedation. Patient denies any oxygen use at home. Patient denies taking any diet/weight loss medications or blood thinners. Patient is not being treated for MRSA or C-diff. EMMI education assisgned to the patient for the procedure, this was explained and instructions given to patient. COVID-19 screening test is on 5/11, the pt is aware.  Patient is aware of our care-partner policy and 0000000 safety protocol.   Prep Prescription coupon given to the patient.

## 2019-05-25 ENCOUNTER — Encounter: Payer: Self-pay | Admitting: Gastroenterology

## 2019-05-26 ENCOUNTER — Other Ambulatory Visit: Payer: Self-pay | Admitting: Gastroenterology

## 2019-05-26 ENCOUNTER — Ambulatory Visit (INDEPENDENT_AMBULATORY_CARE_PROVIDER_SITE_OTHER): Payer: 59

## 2019-05-26 DIAGNOSIS — Z1159 Encounter for screening for other viral diseases: Secondary | ICD-10-CM

## 2019-05-26 LAB — SARS CORONAVIRUS 2 (TAT 6-24 HRS): SARS Coronavirus 2: NEGATIVE

## 2019-05-28 ENCOUNTER — Other Ambulatory Visit: Payer: Self-pay

## 2019-05-28 ENCOUNTER — Encounter: Payer: Self-pay | Admitting: Gastroenterology

## 2019-05-28 ENCOUNTER — Ambulatory Visit (AMBULATORY_SURGERY_CENTER): Payer: 59 | Admitting: Gastroenterology

## 2019-05-28 VITALS — BP 146/73 | HR 57 | Temp 96.2°F | Resp 16 | Ht 61.0 in | Wt 212.0 lb

## 2019-05-28 DIAGNOSIS — D120 Benign neoplasm of cecum: Secondary | ICD-10-CM

## 2019-05-28 DIAGNOSIS — Z1211 Encounter for screening for malignant neoplasm of colon: Secondary | ICD-10-CM

## 2019-05-28 DIAGNOSIS — D123 Benign neoplasm of transverse colon: Secondary | ICD-10-CM

## 2019-05-28 MED ORDER — SODIUM CHLORIDE 0.9 % IV SOLN
500.0000 mL | Freq: Once | INTRAVENOUS | Status: DC
Start: 2019-05-28 — End: 2019-05-28

## 2019-05-28 NOTE — Progress Notes (Signed)
Pt's states no medical or surgical changes since previsit or office visit. Temp by LS. VS by CW.

## 2019-05-28 NOTE — Progress Notes (Signed)
Called to room to assist during endoscopic procedure.  Patient ID and intended procedure confirmed with present staff. Received instructions for my participation in the procedure from the performing physician.  

## 2019-05-28 NOTE — Progress Notes (Signed)
Report to PACU, RN, vss, BBS= Clear.  

## 2019-05-28 NOTE — Patient Instructions (Signed)
HANDOUTS PROVIDED ON: POLYPS  The polyps removed today have been sent for pathology.  The results can take 1-3 weeks to receive.  When your next colonoscopy should occur will be based on the pathology results.    You may resume your previous diet and medication schedule.  Thank you for allowing us to care for you today!!!   YOU HAD AN ENDOSCOPIC PROCEDURE TODAY AT THE [facility]:   Refer to the procedure report that was given to you for any specific questions about what was found during the examination.  If the procedure report does not answer your questions, please call your gastroenterologist to clarify.  If you requested that your care partner not be given the details of your procedure findings, then the procedure report has been included in a sealed envelope for you to review at your convenience later.  YOU SHOULD EXPECT: Some feelings of bloating in the abdomen. Passage of more gas than usual.  Walking can help get rid of the air that was put into your GI tract during the procedure and reduce the bloating. If you had a lower endoscopy (such as a colonoscopy or flexible sigmoidoscopy) you may notice spotting of blood in your stool or on the toilet paper. If you underwent a bowel prep for your procedure, you may not have a normal bowel movement for a few days.  Please Note:  You might notice some irritation and congestion in your nose or some drainage.  This is from the oxygen used during your procedure.  There is no need for concern and it should clear up in a day or so.  SYMPTOMS TO REPORT IMMEDIATELY:   Following lower endoscopy (colonoscopy or flexible sigmoidoscopy):  Excessive amounts of blood in the stool  Significant tenderness or worsening of abdominal pains  Swelling of the abdomen that is new, acute  Fever of 100F or higher  For urgent or emergent issues, a gastroenterologist can be reached at any hour by calling [phone]. Do not use MyChart messaging for  urgent concerns.    DIET:  We do recommend a small meal at first, but then you may proceed to your regular diet.  Drink plenty of fluids but you should avoid alcoholic beverages for 24 hours.  ACTIVITY:  You should plan to take it easy for the rest of today and you should NOT DRIVE or use heavy machinery until tomorrow (because of the sedation medicines used during the test).    FOLLOW UP: Our staff will call the number listed on your records 48-72 hours following your procedure to check on you and address any questions or concerns that you may have regarding the information given to you following your procedure. If we do not reach you, we will leave a message.  We will attempt to reach you two times.  During this call, we will ask if you have developed any symptoms of COVID 19. If you develop any symptoms (ie: fever, flu-like symptoms, shortness of breath, cough etc.) before then, please call [phone].  If you test positive for Covid 19 in the 2 weeks post procedure, please call and report this information to us.    If any biopsies were taken you will be contacted by phone or by letter within the next 1-3 weeks.  Please call us at [phone] if you have not heard about the biopsies in 3 weeks.    SIGNATURES/CONFIDENTIALITY: You and/or your care partner have signed paperwork which will be entered into   your electronic medical record.  These signatures attest to the fact that that the information above on your After Visit Summary has been reviewed and is understood.  Full responsibility of the confidentiality of this discharge information lies with you and/or your care-partner.

## 2019-06-01 ENCOUNTER — Telehealth: Payer: Self-pay

## 2019-06-01 NOTE — Telephone Encounter (Signed)
  Follow up Call-  Call back number 05/28/2019  Post procedure Call Back phone  # [phone]  Permission to leave phone message Yes  Some recent data might be hidden     Patient questions:  Do you have a fever, pain , or abdominal swelling? No. Pain Score  0 *  Have you tolerated food without any problems? Yes.    Have you been able to return to your normal activities? Yes.    Do you have any questions about your discharge instructions: Diet   No. Medications  No. Follow up visit  No.  Do you have questions or concerns about your Care? No.  Actions: * If pain score is 4 or above: No action needed, pain <4. 1. Have you developed a fever since your procedure? no  2.   Have you had an respiratory symptoms (SOB or cough) since your procedure? no  3.   Have you tested positive for COVID 19 since your procedure no  4.   Have you had any family members/close contacts diagnosed with the COVID 19 since your procedure?  no   If yes to any of these questions please route to Joylene John, RN and Erenest Rasher, RN

## 2019-06-11 ENCOUNTER — Encounter: Payer: Self-pay | Admitting: Gastroenterology

## 2019-07-16 ENCOUNTER — Encounter: Payer: Self-pay | Admitting: Physician Assistant

## 2019-07-16 ENCOUNTER — Ambulatory Visit: Payer: 59 | Admitting: Physician Assistant

## 2019-07-16 VITALS — BP 140/90 | HR 64 | Ht 61.0 in | Wt 209.4 lb

## 2019-07-16 DIAGNOSIS — K644 Residual hemorrhoidal skin tags: Secondary | ICD-10-CM

## 2019-07-16 NOTE — Patient Instructions (Signed)
Use Recticare Complete over the counter apply 3 times daily  Use Moisten wipes  Call back in one month if symptoms are not improved , ask for Dr Silvio Pate nurse and we can refer you to surgery    Hemorrhoids Hemorrhoids are swollen veins that may develop:  In the butt (rectum). These are called internal hemorrhoids.  Around the opening of the butt (anus). These are called external hemorrhoids. Hemorrhoids can cause pain, itching, or bleeding. Most of the time, they do not cause serious problems. They usually get better with diet changes, lifestyle changes, and other home treatments. What are the causes? This condition may be caused by:  Having trouble pooping (constipation).  Pushing hard (straining) to poop.  Watery poop (diarrhea).  Pregnancy.  Being very overweight (obese).  Sitting for long periods of time.  Heavy lifting or other activity that causes you to strain.  Anal sex.  Riding a bike for a long period of time. What are the signs or symptoms? Symptoms of this condition include:  Pain.  Itching or soreness in the butt.  Bleeding from the butt.  Leaking poop.  Swelling in the area.  One or more lumps around the opening of your butt. How is this diagnosed? A doctor can often diagnose this condition by looking at the affected area. The doctor may also:  Do an exam that involves feeling the area with a gloved hand (digital rectal exam).  Examine the area inside your butt using a small tube (anoscope).  Order blood tests. This may be done if you have lost a lot of blood.  Have you get a test that involves looking inside the colon using a flexible tube with a camera on the end (sigmoidoscopy or colonoscopy). How is this treated? This condition can usually be treated at home. Your doctor may tell you to change what you eat, make lifestyle changes, or try home treatments. If these do not help, procedures can be done to remove the hemorrhoids or make them  smaller. These may involve:  Placing rubber bands at the base of the hemorrhoids to cut off their blood supply.  Injecting medicine into the hemorrhoids to shrink them.  Shining a type of light energy onto the hemorrhoids to cause them to fall off.  Doing surgery to remove the hemorrhoids or cut off their blood supply. Follow these instructions at home: Eating and drinking   Eat foods that have a lot of fiber in them. These include whole grains, beans, nuts, fruits, and vegetables.  Ask your doctor about taking products that have added fiber (fibersupplements).  Reduce the amount of fat in your diet. You can do this by: ? Eating low-fat dairy products. ? Eating less red meat. ? Avoiding processed foods.  Drink enough fluid to keep your pee (urine) pale yellow. Managing pain and swelling   Take a warm-water bath (sitz bath) for 20 minutes to ease pain. Do this 3-4 times a day. You may do this in a bathtub or using a portable sitz bath that fits over the toilet.  If told, put ice on the painful area. It may be helpful to use ice between your warm baths. ? Put ice in a plastic bag. ? Place a towel between your skin and the bag. ? Leave the ice on for 20 minutes, 2-3 times a day. General instructions  Take over-the-counter and prescription medicines only as told by your doctor. ? Medicated creams and medicines may be used as told.  Exercise  often. Ask your doctor how much and what kind of exercise is best for you.  Go to the bathroom when you have the urge to poop. Do not wait.  Avoid pushing too hard when you poop.  Keep your butt dry and clean. Use wet toilet paper or moist towelettes after pooping.  Do not sit on the toilet for a long time.  Keep all follow-up visits as told by your doctor. This is important. Contact a doctor if you:  Have pain and swelling that do not get better with treatment or medicine.  Have trouble pooping.  Cannot poop.  Have pain or  swelling outside the area of the hemorrhoids. Get help right away if you have:  Bleeding that will not stop. Summary  Hemorrhoids are swollen veins in the butt or around the opening of the butt.  They can cause pain, itching, or bleeding.  Eat foods that have a lot of fiber in them. These include whole grains, beans, nuts, fruits, and vegetables.  Take a warm-water bath (sitz bath) for 20 minutes to ease pain. Do this 3-4 times a day. This information is not intended to replace advice given to you by your health care provider. Make sure you discuss any questions you have with your health care provider. Document Revised: 01/09/2018 Document Reviewed: 05/23/2017 Elsevier Patient Education  [place].    I appreciate the  opportunity to care for you  Thank You   Amy Catlett, PA-C

## 2019-07-21 ENCOUNTER — Encounter: Payer: Self-pay | Admitting: Physician Assistant

## 2019-07-21 NOTE — Progress Notes (Signed)
Subjective:    Patient ID: Kelli Ferrell, female    DOB: 19-Nov-1970, 49 y.o.   MRN: [id number]  HPI Kelli Ferrell is a pleasant 49 year old female, known to Dr. Fuller Plan who comes in today with complaints of symptomatic hemorrhoids and pruritus. She had just undergone colonoscopy on 05/29/2019 with finding of 3 sessile polyps 7 to 8 mm in size all removed and biopsy showing 2 tubular adenomas and 1 sessile serrated polyp.  She is indicated for 3-year interval follow-up.  There were no internal hemorrhoids noted at the time of colonoscopy. She says that over the past few months she has occasionally seen a small amount of blood on the tissue after her bowel movements.  She has also been having a lot of issues with perianal itching.  She has been using Preparation H cream, wipes and Tucks pads over the past month without any improvement in symptoms.  She says her symptoms are generally worse at night.  Bowel movements have been normal, no excessive straining constipation etc.  Review of Systems Pertinent positive and negative review of systems were noted in the above HPI section.  All other review of systems was otherwise negative.  Outpatient Encounter Medications as of 07/16/2019  Medication Sig   cetirizine (ZYRTEC) 10 MG tablet Take 10 mg by mouth daily.   levothyroxine (SYNTHROID) 100 MCG tablet TAKE 1 TABLET DAILY BEFORE BREAKFAST   Multiple Vitamin (MULTIVITAMIN) tablet Take 1 tablet by mouth daily.   pantoprazole (PROTONIX) 40 MG tablet Take 1 tablet (40 mg total) by mouth daily.   No facility-administered encounter medications on file as of 07/16/2019.   No Known Allergies Patient Active Problem List   Diagnosis Date Noted   Uterine leiomyoma 05/06/2019   Pain in pelvis 05/06/2019   Abnormal mammogram 05/06/2019   Morbid obesity with BMI of 40.0-44.9, adult ([place]) 05/06/2019   Odynophagia 06/14/2016   Rectal bleeding 06/14/2016   Seasonal allergies 11/22/2015   Thyroid  activity decreased 11/18/2014   Morbid obesity ([place]) 11/18/2014   Dyslipidemia 11/18/2014   Social History   Socioeconomic History   Marital status: Married    Spouse name: Not on file   Number of children: Not on file   Years of education: Not on file   Highest education level: Not on file  Occupational History   Not on file  Tobacco Use   Smoking status: Never Smoker   Smokeless tobacco: Never Used  Vaping Use   Vaping Use: Never used  Substance and Sexual Activity   Alcohol use: Yes    Alcohol/week: 1.0 standard drink    Types: 1 Standard drinks or equivalent per week   Drug use: No   Sexual activity: Yes    Birth control/protection: Other-see comments    Comment: tubal  Other Topics Concern   Not on file  Social History Narrative   Work or School: [employer], music      Home Situation: lives with husband and two children      Spiritual Beliefs: Christian      Lifestyle: regular exercise and healthy diet            Social Determinants of Health   Financial Resource Strain:    Difficulty of Paying Living Expenses:   Food Insecurity:    Worried About Charity fundraiser in the Last Year:    Arboriculturist in the Last Year:   Transportation Needs:    Film/video editor (Medical):  Lack of Transportation (Non-Medical):   Physical Activity:    Days of Exercise per Week:    Minutes of Exercise per Session:   Stress:    Feeling of Stress :   Social Connections:    Frequency of Communication with Friends and Family:    Frequency of Social Gatherings with Friends and Family:    Attends Religious Services:    Active Member of Clubs or Organizations:    Attends Music therapist:    Marital Status:   Intimate Partner Violence:    Fear of Current or Ex-Partner:    Emotionally Abused:    Physically Abused:    Sexually Abused:     Ms. Bettinger family history includes Diabetes in her  brother, father, and mother; Hypertension in her mother; Stroke in her mother.      Objective:    Vitals:   07/16/19 0841  BP: 140/90  Pulse: 64    Physical Exam Well-developed well-nourished female in no acute distress.  Height, Weight209, BMI 39.5  HEENT; nontraumatic normocephalic, EOMI, PER R LA, sclera anicteric. Oropharynx; not examined Neck; supple, no JVD Cardiovascular; regular rate and rhythm with S1-S2, no murmur rub or gallop Pulmonary; Clear bilaterally Abdomen; soft, nontender, nondistended, no palpable mass or hepatosplenomegaly, bowel sounds are active Rectal; small less than pea-sized external hemorrhoid minimally edematous, digital exam negative-no perianal erythema or rash, no excoriations Skin; benign exam, no jaundice rash or appreciable lesions Extremities; no clubbing cyanosis or edema skin warm and dry Neuro/Psych; alert and oriented x4, grossly nonfocal mood and affect appropriate       Assessment & Plan:   #7 49 year old female with symptomatic external hemorrhoid, no thrombosis.  Patient primarily symptomatic with pruritus, occasional very small-volume bright red blood on the tissue. #2 tubular adenomatous and sessile serrated polyps found at recent colonoscopy, patient indicated for 3-year interval follow-up.  Plan; patient was advised that we do not have any GI intervention to remove external hemorrhoids.  She may want to have surgical referral if symptoms do not improve in the next few months.  However explained that her hemorrhoid is very small and not thrombosed. Daily use of hypoallergenic baby wipes/moistened tissue. Start trial of RectiCare complete OTC use 3 times daily including a dose after bowel movements and at bedtime. Have asked her to call back in 1 month if she does not have any improvement in symptoms and would like to be referred to surgery for opinion. Plan follow-up colonoscopy with Dr. Fuller Plan in 3 years.  Kohana Amble S Yuma Blucher  PA-C 07/21/2019   Cc: Isaac Bliss, [place]*

## 2019-07-23 NOTE — Progress Notes (Signed)
Reviewed and agree with management plan.  Bethania Schlotzhauer T. Brookelynne Dimperio, MD FACG [facility]  

## 2019-07-26 ENCOUNTER — Other Ambulatory Visit: Payer: Self-pay | Admitting: Family Medicine

## 2019-07-26 DIAGNOSIS — E039 Hypothyroidism, unspecified: Secondary | ICD-10-CM

## 2019-08-17 IMAGING — MR MR HEAD WO/W CM
12 series · 48 of 48 positions shown · IV contrast (20ml Multihance)
Comparison: Head CT 03/04/2014

CLINICAL DATA: Acute headache

EXAM:
MRI HEAD WITHOUT AND WITH CONTRAST
TECHNIQUE: Multiplanar, multiecho pulse sequences of the brain and surrounding
structures were obtained without and with intravenous contrast.
CONTRAST:  20mL MULTIHANCE GADOBENATE DIMEGLUMINE 529 MG/ML IV SOLN

[Series 2: t1_se_sag · sagittal · 5.0mm · 0.45mm/px · 3 of 23 slices shown]
[im 1/23]
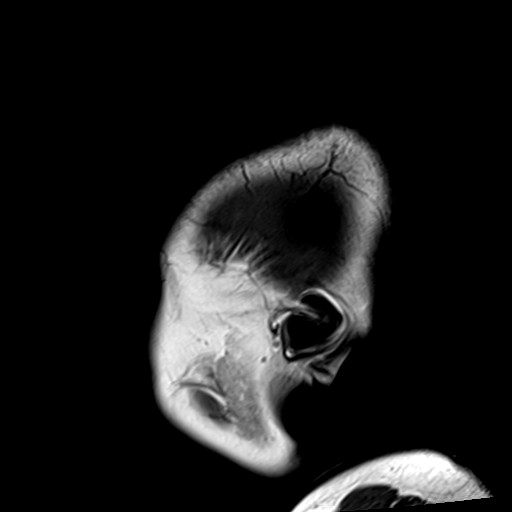
[im 12/23]
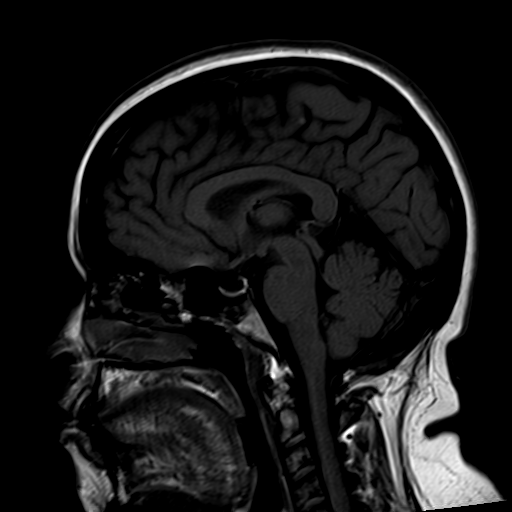
[im 23/23]
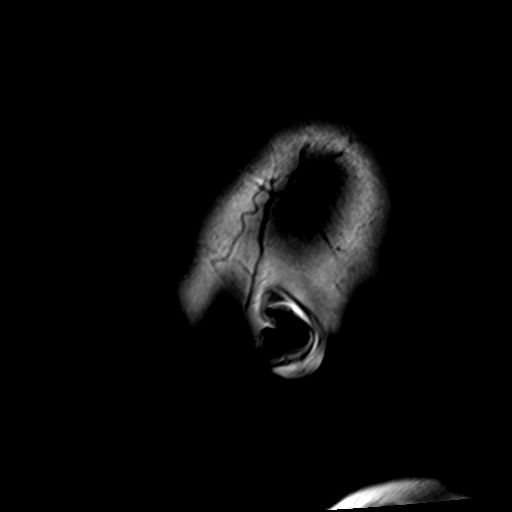

[Series 3: ep2d_diff_(id)_trace · axial · 3.0mm · 1.80mm/px · z∈[-49,+97]mm · 6 of 98 slices shown]
[im 1/98]
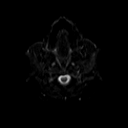
[im 20/98]
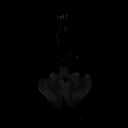
[im 39/98]
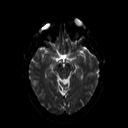
[im 59/98]
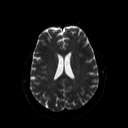
[im 78/98]
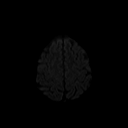
[im 98/98]
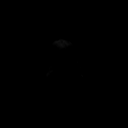

[Series 4: ep2d_diff_(id)_trace_adc · axial · 3.0mm · 1.80mm/px · z∈[-49,+97]mm · 3 of 50 slices shown]
[im 1/50]
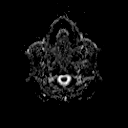
[im 25/50]
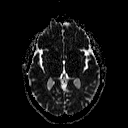
[im 50/50]
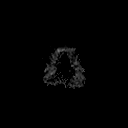

[Series 5: ep2d_diff_cor · coronal · 5.0mm · 1.77mm/px · 3 of 52 slices shown]
[im 1/52]
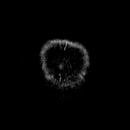
[im 26/52]
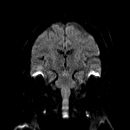
[im 52/52]
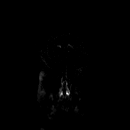

[Series 6: ep2d_diff_cor_adc · coronal · 5.0mm · 1.77mm/px · 2 of 26 slices shown]
[im 1/26]
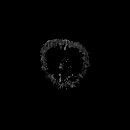
[im 26/26]
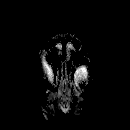

[Series 7: FLAIR · axial · 3.0mm · 0.45mm/px · z∈[-57,+104]mm · 2 of 28 slices shown]
[im 1/28]
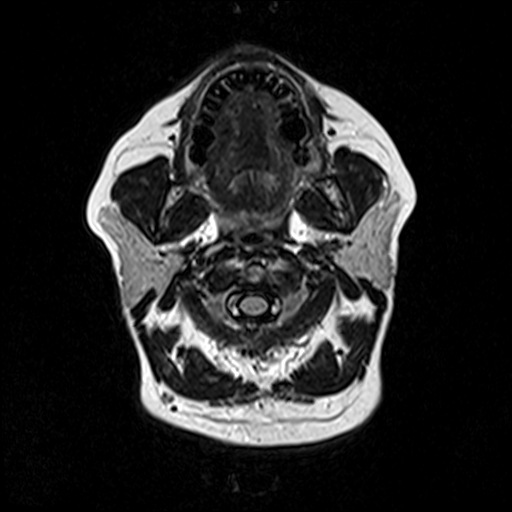
[im 28/28]
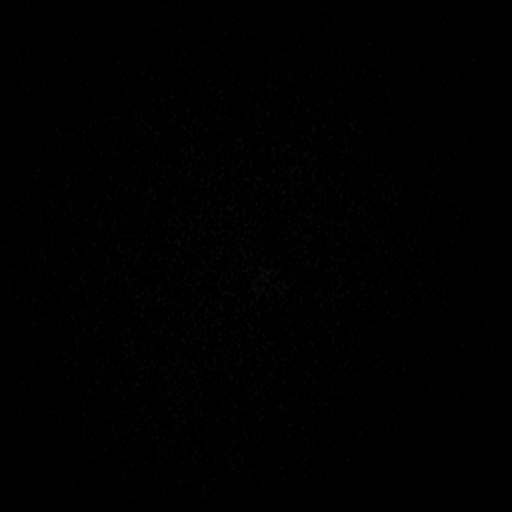

[Series 8: t2_tse_tra · axial · 5.0mm · 0.60mm/px · z∈[-50,+98]mm · 2 of 26 slices shown]
[im 1/26]
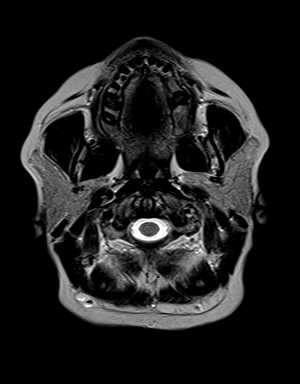
[im 26/26]
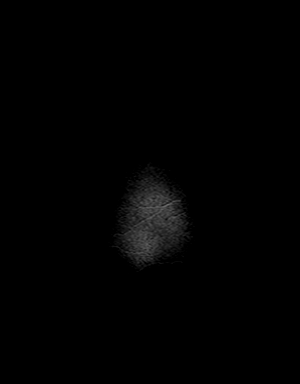

[Series 10: swi_images · axial · 2.0mm · 0.90mm/px · z∈[-54,+102]mm · 5 of 80 slices shown]
[im 1/80]
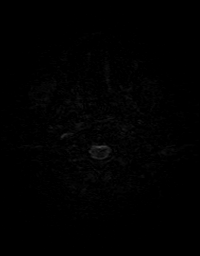
[im 20/80]
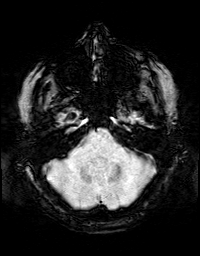
[im 40/80]
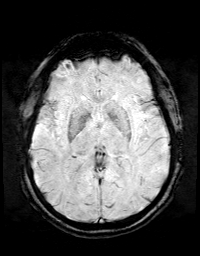
[im 60/80]
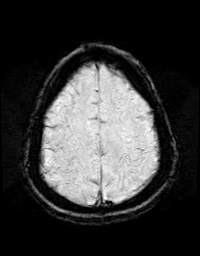
[im 80/80]
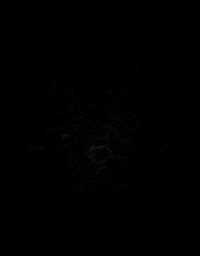

[Series 11: t1_mpr_tra · axial · 1.1mm · 0.72mm/px · z∈[-56,+103]mm · 9 of 144 slices shown]
[im 1/144]
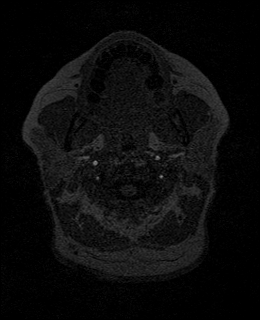
[im 18/144]
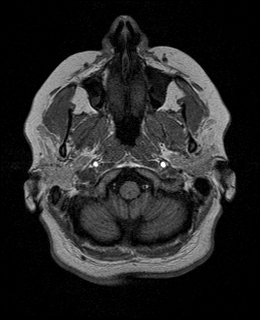
[im 36/144]
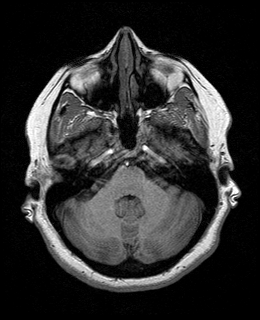
[im 54/144]
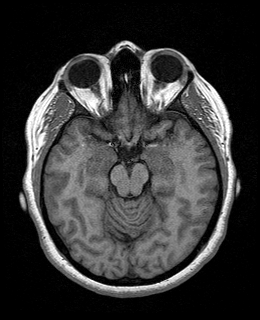
[im 72/144]
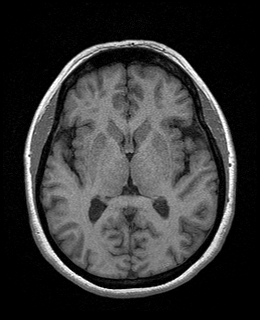
[im 90/144]
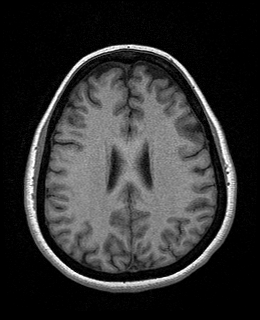
[im 108/144]
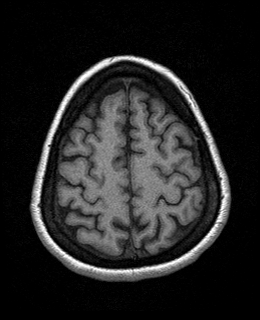
[im 126/144]
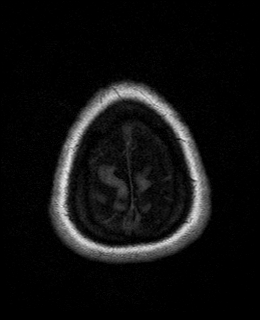
[im 144/144]
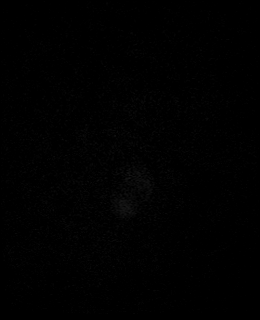

[Series 12: T2 post-contrast · coronal · 5.0mm · 0.45mm/px · 2 of 30 slices shown]
[im 1/30]
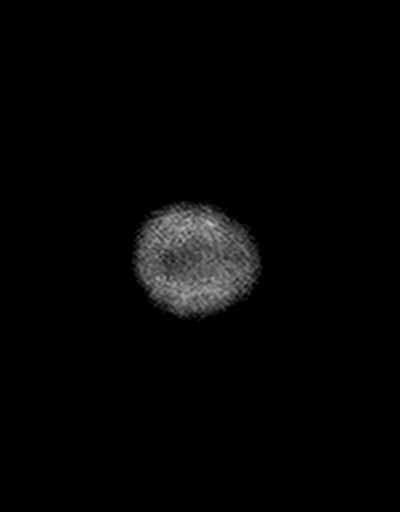
[im 30/30]
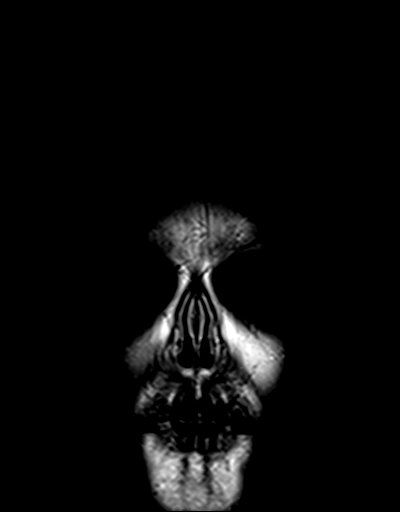

[Series 13: post t1_mpr_tra · axial · 1.1mm · 0.72mm/px · z∈[-56,+103]mm · 9 of 144 slices shown]
[im 1/144]
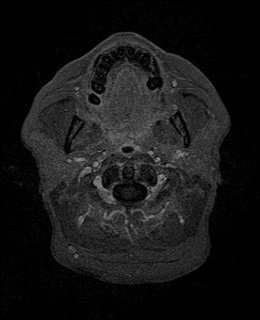
[im 18/144]
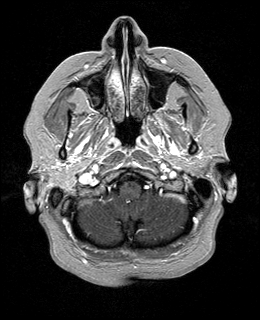
[im 36/144]
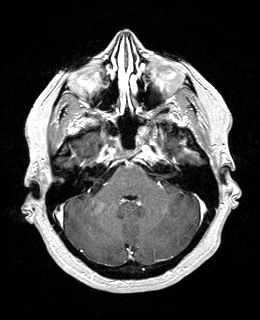
[im 54/144]
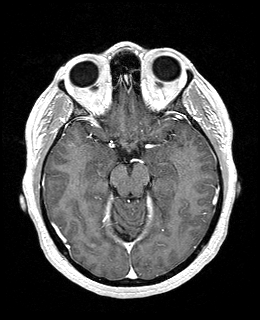
[im 72/144]
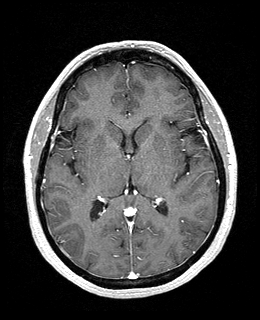
[im 90/144]
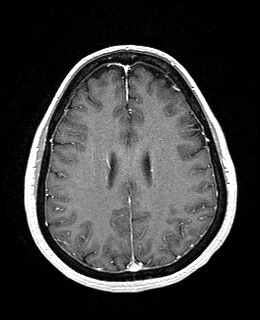
[im 108/144]
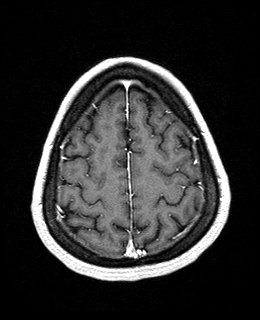
[im 126/144]
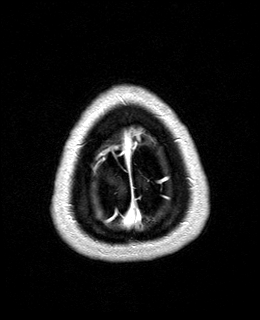
[im 144/144]
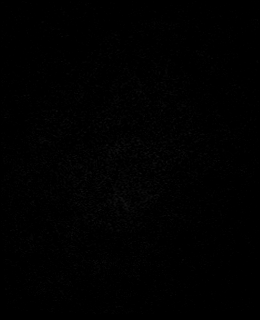

[Series 14: T1 post-contrast · coronal · 5.0mm · 0.72mm/px · 2 of 30 slices shown]
[im 1/30]
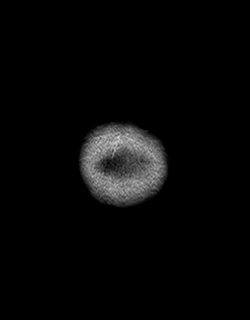
[im 30/30]
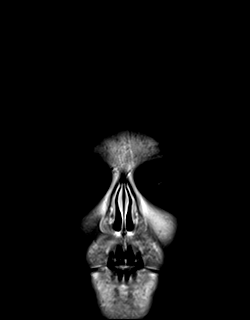

[48 of 48 positions shown; findings below may reference images not displayed]

FINDINGS: BRAIN: There is no acute infarct, acute hemorrhage or mass effect.
The midline structures are normal. There are no old infarcts. The
white matter signal is normal for the patient's age. The CSF spaces
are normal for age, with no hydrocephalus. Susceptibility-sensitive
sequences show no chronic microhemorrhage or superficial siderosis.

VASCULAR: Major intracranial arterial and venous sinus flow voids
are preserved.

SKULL AND UPPER CERVICAL SPINE: The visualized skull base,
calvarium, upper cervical spine and extracranial soft tissues are
normal.

SINUSES/ORBITS: Bilateral maxillary sinus retention cysts. The other
paranasal sinuses are clear. There is no mastoid or middle ear
effusion. The orbits are normal.
IMPRESSION: Normal MRI of the brain.

## 2019-10-24 ENCOUNTER — Other Ambulatory Visit: Payer: Self-pay | Admitting: Internal Medicine

## 2019-10-24 DIAGNOSIS — E039 Hypothyroidism, unspecified: Secondary | ICD-10-CM

## 2019-11-05 ENCOUNTER — Other Ambulatory Visit: Payer: Self-pay

## 2019-11-05 ENCOUNTER — Telehealth (INDEPENDENT_AMBULATORY_CARE_PROVIDER_SITE_OTHER): Payer: 59 | Admitting: Family Medicine

## 2019-11-05 DIAGNOSIS — R059 Cough, unspecified: Secondary | ICD-10-CM

## 2019-11-05 DIAGNOSIS — R030 Elevated blood-pressure reading, without diagnosis of hypertension: Secondary | ICD-10-CM

## 2019-11-05 DIAGNOSIS — R0981 Nasal congestion: Secondary | ICD-10-CM | POA: Diagnosis not present

## 2019-11-05 MED ORDER — DOXYCYCLINE HYCLATE 100 MG PO TABS: 100.0000 mg | ORAL_TABLET | Freq: Two times a day (BID) | ORAL | 0 refills | Status: DC

## 2019-11-05 MED ORDER — BENZONATATE 100 MG PO CAPS: 100.0000 mg | ORAL_CAPSULE | Freq: Three times a day (TID) | ORAL | 0 refills | Status: DC | PRN

## 2019-11-05 NOTE — Progress Notes (Signed)
Virtual Visit via Video Note  I connected with Kelli Ferrell  on 11/05/19 at  1:00 PM EDT by a video enabled telemedicine application and verified that I am speaking with the correct person using two identifiers.  Location patient: home Location provider:work or home office Persons participating in the virtual visit: patient, provider  I discussed the limitations of evaluation and management by telemedicine and the availability of in person appointments. The patient expressed understanding and agreed to proceed.   HPI:  Acute telemedicine visit for : -Onset: a little over 1 week ago -Symptoms include: sneezing and congestion initially with nasal congestion, loss of taste initially, cough, sore throat, clammy on and off, diarrhea, had a little chest discomfort the first few days - none since, feels tired -now worsening with sinus discomfort -had several negative home covid tests, followed by a negative PCR test about 3 days into the illness -Denies: fevers, SOB, CP, vomiting -BP 126/91 -Has tried: sudafed -Pertinent past medical history: -Pertinent medication allergies: -COVID-19 vaccine status: fully vaccinated for covid with pfizer, 2nd dose July  ROS: See pertinent positives and negatives per HPI.  Past Medical History:  Diagnosis Date  . Allergy   . Blood in stool   . Dyslipidemia 11/18/2014  . GERD (gastroesophageal reflux disease)   . Hypothyroidism   . Kidney stones   . Morbid obesity ([place]) 11/18/2014  . 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 Seasonal allergies 11/22/2015  . Urinary tract infection     Past Surgical History:  Procedure Laterality Date  . DILATION AND CURETTAGE OF UTERUS    . UTERINE FIBROID EMBOLIZATION       Current Outpatient Medications:  .  benzonatate (TESSALON PERLES)  100 MG capsule, Take 1 capsule (100 mg total) by mouth 3 (three) times daily as needed., Disp: 20 capsule, Rfl: 0 .  cetirizine (ZYRTEC) 10 MG tablet, Take 10 mg by mouth daily., Disp: , Rfl:  .  doxycycline (VIBRA-TABS) 100 MG tablet, Take 1 tablet (100 mg total) by mouth 2 (two) times daily., Disp: 20 tablet, Rfl: 0 .  levothyroxine (SYNTHROID) 100 MCG tablet, TAKE 1 TABLET DAILY BEFORE BREAKFAST (NEED AN APPOINTMENT), Disp: 90 tablet, Rfl: 3 .  Multiple Vitamin (MULTIVITAMIN) tablet, Take 1 tablet by mouth daily., Disp: , Rfl:  .  pantoprazole (PROTONIX) 40 MG tablet, Take 1 tablet (40 mg total) by mouth daily., Disp: 90 tablet, Rfl: 1  EXAM:  VITALS per patient if applicable:  GENERAL: alert, oriented, appears well and in no acute distress  HEENT: atraumatic, conjunttiva clear, no obvious abnormalities on inspection of external nose and ears  NECK: normal movements of the head and neck  LUNGS: on inspection no signs of respiratory distress, breathing rate appears normal, no obvious gross SOB, gasping or wheezing  CV: no obvious cyanosis  MS: moves all visible extremities without noticeable abnormality  PSYCH/NEURO: pleasant and cooperative, no obvious depression or anxiety, speech and thought processing grossly intact  ASSESSMENT AND PLAN:  Discussed the following assessment and plan:  Sinus congestion  Cough  Elevated BP without diagnosis of hypertension  -we discussed possible serious and likely etiologies, options for evaluation and workup, limitations of telemedicine visit vs in person visit, treatment, treatment risks and precautions. Pt prefers to treat via telemedicine empirically rather than in person  at this moment. Opted for tessalon for cough, nasal saline, short course nasal decongestant and delayed abx for possible sinusitis if worsening or not improving.  For the elevated BP, discussed goals and advised to monitor at home over the next 1 week with follow up if  over goal.  Work/School slipped offered:  declined  Advised to seek prompt follow up telemedicine visit or in person care if worsening, new symptoms arise, or if is not improving with treatment. Did let this patient know that I only do telemedicine on Tuesdays and Thursdays for [place]. Advised to schedule follow up visit with PCP or [facility] if any further questions or concerns to avoid delays in care.   I discussed the assessment and treatment plan with the patient. The patient was provided an opportunity to ask questions and all were answered. The patient agreed with the plan and demonstrated an understanding of the instructions.     Lucretia Kern, DO

## 2019-11-05 NOTE — Patient Instructions (Addendum)
-  stay home while sick  -I sent the medication(s) we discussed to your pharmacy: Meds ordered this encounter  Medications  . doxycycline (VIBRA-TABS) 100 MG tablet    Sig: Take 1 tablet (100 mg total) by mouth 2 (two) times daily.    Dispense:  20 tablet    Refill:  0  . benzonatate (TESSALON PERLES) 100 MG capsule    Sig: Take 1 capsule (100 mg total) by mouth 3 (three) times daily as needed.    Dispense:  20 capsule    Refill:  0    -can use tylenol or aleve if needed for fevers, aches and pains per instructions  -can use nasal saline a few times per day if nasal congestion, sometime a short course of Afrin nasal spray for 3 days can help as well  -stay hydrated, drink plenty of fluids and eat small healthy meals - avoid dairy   -follow up with your doctor in 2-3 days unless improving and feeling better  I hope you are feeling better soon! Seek in-person care or a follow up telemedicine visit promptly if your symptoms worsen, new concerns arise or you are not improving as expected. Call 911 if severe symptoms.

## 2020-04-13 ENCOUNTER — Telehealth (INDEPENDENT_AMBULATORY_CARE_PROVIDER_SITE_OTHER): Payer: 59 | Admitting: Internal Medicine

## 2020-04-13 ENCOUNTER — Encounter: Payer: Self-pay | Admitting: Internal Medicine

## 2020-04-13 VITALS — Wt 212.0 lb

## 2020-04-13 DIAGNOSIS — J0100 Acute maxillary sinusitis, unspecified: Secondary | ICD-10-CM

## 2020-04-13 MED ORDER — AMOXICILLIN-POT CLAVULANATE 875-125 MG PO TABS: 1.0000 | ORAL_TABLET | Freq: Two times a day (BID) | ORAL | 0 refills | Status: AC

## 2020-04-13 NOTE — Progress Notes (Signed)
Virtual Visit via Video Note  I connected with Kelli Ferrell on 04/13/20 at 11:30 AM EDT by a video enabled telemedicine application and verified that I am speaking with the correct person using two identifiers.  Location patient: home Location provider: work office Persons participating in the virtual visit: patient, provider  I discussed the limitations of evaluation and management by telemedicine and the availability of in person appointments. The patient expressed understanding and agreed to proceed.   HPI: For 8 days she has been having maxillary and frontal sinus pressure, nasal congestion, postnasal drip and sneezing.  She has not had any fever.  For the past 2 days she has had green, thick nasal secretions when she blows her nose.  She has tried a variety of over-the-counter preparations without relief including Zyrtec, Sudafed, DayQuil/NyQuil, Tylenol, Flonase.  3 days ago she developed right-sided ear pain that has improved as well as what sounds like possibly some right cervical lymphadenopathy.  She has had 2 - rapid antigen Covid tests separated by 4 days.   ROS: Constitutional: Denies fever, chills, diaphoresis, appetite change and fatigue.  HEENT: Denies photophobia, eye pain, redness, mouth sores, trouble swallowing, neck pain, neck stiffness and tinnitus.   Respiratory: Denies SOB, DOE, cough, chest tightness,  and wheezing.   Cardiovascular: Denies chest pain, palpitations and leg swelling.  Gastrointestinal: Denies nausea, vomiting, abdominal pain, diarrhea, constipation, blood in stool and abdominal distention.  Genitourinary: Denies dysuria, urgency, frequency, hematuria, flank pain and difficulty urinating.  Endocrine: Denies: hot or cold intolerance, sweats, changes in hair or nails, polyuria, polydipsia. Musculoskeletal: Denies myalgias, back pain, joint swelling, arthralgias and gait problem.  Skin: Denies pallor, rash and wound.  Neurological: Denies  dizziness, seizures, syncope, weakness, light-headedness, numbness and headaches.  Hematological: Denies adenopathy. Easy bruising, personal or family bleeding history  Psychiatric/Behavioral: Denies suicidal ideation, mood changes, confusion, nervousness, sleep disturbance and agitation   Past Medical History:  Diagnosis Date  . Allergy   . Blood in stool   . Dyslipidemia 11/18/2014  . GERD (gastroesophageal reflux disease)   . Hypothyroidism   . Kidney stones   . Morbid obesity ([place]) 11/18/2014  . 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 Seasonal allergies 11/22/2015  . Urinary tract infection     Past Surgical History:  Procedure Laterality Date  . DILATION AND CURETTAGE OF UTERUS    . UTERINE FIBROID EMBOLIZATION      Family History  Problem Relation Age of Onset  . Diabetes Mother   . Stroke Mother   . Hypertension Mother   . Diabetes Father   . Diabetes Brother   . Colon cancer Neg Hx   . Colon polyps Neg Hx   . Esophageal cancer Neg Hx   . Rectal cancer Neg Hx   . Stomach cancer Neg Hx     SOCIAL HX:   reports that she has never smoked. She has never used smokeless tobacco. She reports current alcohol use of about 1.0 standard drink of alcohol per week. She reports that she does not use drugs.   Current Outpatient Medications:  .  amoxicillin-clavulanate (AUGMENTIN) 875-125 MG tablet, Take 1 tablet by mouth 2 (two) times daily for 7 days., Disp: 14 tablet, Rfl: 0 .  cetirizine (ZYRTEC) 10 MG tablet, Take 10 mg  by mouth daily., Disp: , Rfl:  .  levothyroxine (SYNTHROID) 100 MCG tablet, TAKE 1 TABLET DAILY BEFORE BREAKFAST (NEED AN APPOINTMENT), Disp: 90 tablet, Rfl: 3 .  Multiple Vitamin (MULTIVITAMIN) tablet, Take 1 tablet by mouth daily., Disp: , Rfl:  .  pantoprazole (PROTONIX) 40 MG  tablet, Take 1 tablet (40 mg total) by mouth daily., Disp: 90 tablet, Rfl: 1  EXAM:   VITALS per patient if applicable: None reported  GENERAL: alert, oriented, appears well and in no acute distress, sounds congested  HEENT: atraumatic, conjunttiva clear, no obvious abnormalities on inspection of external nose and ears  NECK: normal movements of the head and neck  LUNGS: on inspection no signs of respiratory distress, breathing rate appears normal, no obvious gross increased work of breathing, gasping or wheezing  CV: no obvious cyanosis  MS: moves all visible extremities without noticeable abnormality  PSYCH/NEURO: pleasant and cooperative, no obvious depression or anxiety, speech and thought processing grossly intact  ASSESSMENT AND PLAN:   Acute maxillary sinusitis, recurrence not specified  -Based on her description it sounds like possibly an acute maxillary/frontal sinusitis and possibly a right-sided ear infection with cervical lymphadenopathy. -Due to duration of symptoms, no relief with OTC preparations and transformation to green secretions, I believe it is appropriate to treat with a course of antibiotics. -We will prescribe Augmentin 875 mg for her to take twice daily for 7 days. -She already has an in office follow-up scheduled for April 12.     I discussed the assessment and treatment plan with the patient. The patient was provided an opportunity to ask questions and all were answered. The patient agreed with the plan and demonstrated an understanding of the instructions.   The patient was advised to call back or seek an in-person evaluation if the symptoms worsen or if the condition fails to improve as anticipated.    Lelon Frohlich, MD  [facility] at [facility]

## 2020-04-27 ENCOUNTER — Other Ambulatory Visit: Payer: Self-pay

## 2020-04-27 ENCOUNTER — Encounter: Payer: Self-pay | Admitting: Internal Medicine

## 2020-04-27 ENCOUNTER — Ambulatory Visit (INDEPENDENT_AMBULATORY_CARE_PROVIDER_SITE_OTHER): Payer: 59 | Admitting: Internal Medicine

## 2020-04-27 VITALS — BP 120/80 | HR 70 | Temp 97.8°F | Ht 61.0 in | Wt 217.5 lb

## 2020-04-27 DIAGNOSIS — K219 Gastro-esophageal reflux disease without esophagitis: Secondary | ICD-10-CM | POA: Diagnosis not present

## 2020-04-27 DIAGNOSIS — E785 Hyperlipidemia, unspecified: Secondary | ICD-10-CM | POA: Diagnosis not present

## 2020-04-27 DIAGNOSIS — Z0000 Encounter for general adult medical examination without abnormal findings: Secondary | ICD-10-CM | POA: Diagnosis not present

## 2020-04-27 DIAGNOSIS — G479 Sleep disorder, unspecified: Secondary | ICD-10-CM

## 2020-04-27 LAB — COMPREHENSIVE METABOLIC PANEL
ALT: 12 U/L (ref 0–35)
AST: 11 U/L (ref 0–37)
Albumin: 3.5 g/dL (ref 3.5–5.2)
Alkaline Phosphatase: 53 U/L (ref 39–117)
BUN: 17 mg/dL (ref 6–23)
CO2: 26 mEq/L (ref 19–32)
Calcium: 8.5 mg/dL (ref 8.4–10.5)
Chloride: 106 mEq/L (ref 96–112)
Creatinine, Ser: 0.79 mg/dL (ref 0.40–1.20)
GFR: 88.09 mL/min (ref >60.00)
Glucose, Bld: 90 mg/dL (ref 70–99)
Potassium: 4.7 mEq/L (ref 3.5–5.1)
Sodium: 137 mEq/L (ref 135–145)
Total Bilirubin: 0.3 mg/dL (ref 0.2–1.2)
Total Protein: 6.2 g/dL (ref 6.0–8.3)

## 2020-04-27 LAB — VITAMIN D 25 HYDROXY (VIT D DEFICIENCY, FRACTURES): VITD: 35.75 ng/mL (ref 30.00–100.00)

## 2020-04-27 LAB — CBC WITH DIFFERENTIAL/PLATELET
Basophils Absolute: 0 10*3/uL (ref 0.0–0.1)
Basophils Relative: 0.3 % (ref 0.0–3.0)
Eosinophils Absolute: 0.1 10*3/uL (ref 0.0–0.7)
Eosinophils Relative: 2.2 % (ref 0.0–5.0)
HCT: 40.3 % (ref 36.0–46.0)
Hemoglobin: 13.5 g/dL (ref 12.0–15.0)
Lymphocytes Relative: 23.6 % (ref 12.0–46.0)
Lymphs Abs: 1.3 10*3/uL (ref 0.7–4.0)
MCHC: 33.6 g/dL (ref 30.0–36.0)
MCV: 87.8 fl (ref 78.0–100.0)
Monocytes Absolute: 0.5 10*3/uL (ref 0.1–1.0)
Monocytes Relative: 8.5 % (ref 3.0–12.0)
Neutro Abs: 3.5 10*3/uL (ref 1.4–7.7)
Neutrophils Relative %: 65.4 % (ref 43.0–77.0)
Platelets: 311 10*3/uL (ref 150.0–400.0)
RBC: 4.59 Mil/uL (ref 3.87–5.11)
RDW: 13.6 % (ref 11.5–15.5)
WBC: 5.4 10*3/uL (ref 4.0–10.5)

## 2020-04-27 LAB — LIPID PANEL
Cholesterol: 174 mg/dL (ref 0–200)
HDL: 37.7 mg/dL — ABNORMAL LOW (ref >39.00)
LDL Cholesterol: 118 mg/dL — ABNORMAL HIGH (ref 0–99)
NonHDL: 136.19
Total CHOL/HDL Ratio: 5
Triglycerides: 93 mg/dL (ref 0.0–149.0)
VLDL: 18.6 mg/dL (ref 0.0–40.0)

## 2020-04-27 LAB — TSH: TSH: 2.56 u[IU]/mL (ref 0.35–4.50)

## 2020-04-27 LAB — HEMOGLOBIN A1C: Hgb A1c MFr Bld: 5.6 % (ref 4.6–6.5)

## 2020-04-27 LAB — VITAMIN B12: Vitamin B-12: 557 pg/mL (ref 211–911)

## 2020-04-27 NOTE — Progress Notes (Signed)
Established Patient Office Visit     This visit occurred during the SARS-CoV-2 public health emergency.  Safety protocols were in place, including screening questions prior to the visit, additional usage of staff PPE, and extensive cleaning of exam room while observing appropriate contact time as indicated for disinfecting solutions.    CC/Reason for Visit: Annual preventive exam  HPI: Kelli Ferrell is a 50 y.o. female who is coming in today for the above mentioned reasons. Past Medical History is significant for: Obesity, GERD, hypothyroidism.  She has routine eye and dental care.  She will start exercising now that the weather is warmer.  She is preparing for a 100 mile bike run.  She sees GYN who keeps track of her breast and cervical cancer screening.  She had a colonoscopy in 2021.  All immunizations are age-appropriate and up-to-date.  She has been having issues with sleeping.  Her husband tells her that sometimes she wakes up gasping for air, she does feel fatigued at times throughout the day.   Past Medical/Surgical History: Past Medical History:  Diagnosis Date  . Allergy   . Blood in stool   . Dyslipidemia 11/18/2014  . GERD (gastroesophageal reflux disease)   . Hypothyroidism   . Kidney stones   . Morbid obesity ([place]) 11/18/2014  . 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 Seasonal allergies 11/22/2015  . Urinary tract infection     Past Surgical History:  Procedure Laterality Date  . DILATION AND CURETTAGE OF UTERUS    . UTERINE FIBROID EMBOLIZATION      Social History:  reports that she has never smoked. She has never used smokeless tobacco. She reports current alcohol use of about 1.0 standard drink of alcohol per week. She reports that she does not use drugs.  Allergies: No  Known Allergies  Family History:  Family History  Problem Relation Age of Onset  . Diabetes Mother   . Stroke Mother   . Hypertension Mother   . Diabetes Father   . Diabetes Brother   . Colon cancer Neg Hx   . Colon polyps Neg Hx   . Esophageal cancer Neg Hx   . Rectal cancer Neg Hx   . Stomach cancer Neg Hx      Current Outpatient Medications:  .  cetirizine (ZYRTEC) 10 MG tablet, Take 10 mg by mouth daily., Disp: , Rfl:  .  levothyroxine (SYNTHROID) 100 MCG tablet, TAKE 1 TABLET DAILY BEFORE BREAKFAST (NEED AN APPOINTMENT), Disp: 90 tablet, Rfl: 3 .  Multiple Vitamin (MULTIVITAMIN) tablet, Take 1 tablet by mouth daily., Disp: , Rfl:  .  pantoprazole (PROTONIX) 40 MG tablet, Take 1 tablet (40 mg total) by mouth daily., Disp: 90 tablet, Rfl: 1  Review of Systems:  Constitutional: Denies fever, chills, diaphoresis, appetite change and fatigue.  HEENT: Denies photophobia, eye pain, redness, hearing loss, ear pain, congestion, sore throat, rhinorrhea, sneezing, mouth sores, trouble swallowing, neck pain, neck stiffness and tinnitus.   Respiratory: Denies SOB, DOE, cough, chest tightness,  and wheezing.   Cardiovascular: Denies chest pain, palpitations and leg swelling.  Gastrointestinal: Denies nausea, vomiting, abdominal pain, diarrhea, constipation, blood in stool and abdominal distention.  Genitourinary: Denies dysuria, urgency, frequency, hematuria, flank pain and difficulty urinating.  Endocrine: Denies: hot or cold intolerance, sweats,  changes in hair or nails, polyuria, polydipsia. Musculoskeletal: Denies myalgias, back pain, joint swelling, arthralgias and gait problem.  Skin: Denies pallor, rash and wound.  Neurological: Denies dizziness, seizures, syncope, weakness, light-headedness, numbness and headaches.  Hematological: Denies adenopathy. Easy bruising, personal or family bleeding history  Psychiatric/Behavioral: Denies suicidal ideation, mood changes, confusion,  nervousness, sleep disturbance and agitation    Physical Exam: Vitals:   04/27/20 0759  BP: 120/80  Pulse: 70  Temp: 97.8 F (36.6 C)  TempSrc: Oral  SpO2: 98%  Weight: 217 lb 8 oz (98.7 kg)  Height: $Remove'5\' 1"'[id number]$  (1.549 m)    Body mass index is 41.1 kg/m.   Constitutional: NAD, calm, comfortable Eyes: PERRL, lids and conjunctivae normal ENMT: Mucous membranes are moist. Posterior pharynx clear of any exudate or lesions. Normal dentition. Tympanic membrane is pearly white, no erythema or bulging. Neck: normal, supple, no masses, no thyromegaly Respiratory: clear to auscultation bilaterally, no wheezing, no crackles. Normal respiratory effort. No accessory muscle use.  Cardiovascular: Regular rate and rhythm, no murmurs / rubs / gallops. No extremity edema. 2+ pedal pulses.  Abdomen: no tenderness, no masses palpated. No hepatosplenomegaly. Bowel sounds positive.  Musculoskeletal: no clubbing / cyanosis. No joint deformity upper and lower extremities. Good ROM, no contractures. Normal muscle tone.  Skin: no rashes, lesions, ulcers. No induration Neurologic: CN 2-12 grossly intact. Sensation intact, DTR normal. Strength 5/5 in all 4.  Psychiatric: Normal judgment and insight. Alert and oriented x 3. Normal mood.    Impression and Plan:  Encounter for preventive health examination -Advised routine eye and dental care. -All immunizations are up-to-date and age-appropriate. -Screening labs today. -Healthy lifestyle discussed in detail, including need to incorporate more physical activity into her daily routine. -She had a colonoscopy in 2021 and is a 5-year callback. -She is due for mammogram this month as well as Pap smear this year, she has an appointment coming up soon with gynecology for this.  Morbid obesity ([place]) -Discussed healthy lifestyle, including increased physical activity and better food choices to promote weight loss.  Dyslipidemia  - Plan: Lipid panel -Not yet on  medication.  Gastroesophageal reflux disease without esophagitis -She uses Protonix only as needed.  Sleep disorder  - Plan: Ambulatory referral to Neurology for sleep study. -We have discussed sleep hygiene in detail.   Patient Instructions   -Nice seeing you today!!  -Lab work today; will notify you once results are available.  -Schedule follow up in 1 year or sooner as needed.   Preventive Care 59-87 Years Old, Female Preventive care refers to lifestyle choices and visits with your health care provider that can promote health and wellness. This includes:  A yearly physical exam. This is also called an annual wellness visit.  Regular dental and eye exams.  Immunizations.  Screening for certain conditions.  Healthy lifestyle choices, such as: ? Eating a healthy diet. ? Getting regular exercise. ? Not using drugs or products that contain nicotine and tobacco. ? Limiting alcohol use. What can I expect for my preventive care visit? Physical exam Your health care provider will check your:  Height and weight. These may be used to calculate your BMI (body mass index). BMI is a measurement that tells if you are at a healthy weight.  Heart rate and blood pressure.  Body temperature.  Skin for abnormal spots. Counseling Your health care provider may ask you questions about your:  Past medical problems.  Family's medical history.  Alcohol, tobacco, and drug use.  Emotional well-being.  Home life and relationship well-being.  Sexual activity.  Diet, exercise, and sleep habits.  Work and work Statistician.  Access to firearms.  Method of birth control.  Menstrual cycle.  Pregnancy history. What immunizations do I need? Vaccines are usually given at various ages, according to a schedule. Your health care provider will recommend vaccines for you based on your age, medical history, and lifestyle or other factors, such as travel or where you work.   What  tests do I need? Blood tests  Lipid and cholesterol levels. These may be checked every 5 years, or more often if you are over 66 years old.  Hepatitis C test.  Hepatitis B test. Screening  Lung cancer screening. You may have this screening every year starting at age 71 if you have a 30-pack-year history of smoking and currently smoke or have quit within the past 15 years.  Colorectal cancer screening. ? All adults should have this screening starting at age 41 and continuing until age 57. ? Your health care provider may recommend screening at age 99 if you are at increased risk. ? You will have tests every 1-10 years, depending on your results and the type of screening test.  Diabetes screening. ? This is done by checking your blood sugar (glucose) after you have not eaten for a while (fasting). ? You may have this done every 1-3 years.  Mammogram. ? This may be done every 1-2 years. ? Talk with your health care provider about when you should start having regular mammograms. This may depend on whether you have a family history of breast cancer.  BRCA-related cancer screening. This may be done if you have a family history of breast, ovarian, tubal, or peritoneal cancers.  Pelvic exam and Pap test. ? This may be done every 3 years starting at age 8. ? Starting at age 22, this may be done every 5 years if you have a Pap test in combination with an HPV test. Other tests  STD (sexually transmitted disease) testing, if you are at risk.  Bone density scan. This is done to screen for osteoporosis. You may have this scan if you are at high risk for osteoporosis. Talk with your health care provider about your test results, treatment options, and if necessary, the need for more tests. Follow these instructions at home: Eating and drinking  Eat a diet that includes fresh fruits and vegetables, whole grains, lean protein, and low-fat dairy products.  Take vitamin and mineral supplements  as recommended by your health care provider.  Do not drink alcohol if: ? Your health care provider tells you not to drink. ? You are pregnant, may be pregnant, or are planning to become pregnant.  If you drink alcohol: ? Limit how much you have to 0-1 drink a day. ? Be aware of how much alcohol is in your drink. In the [place] one drink equals one 12 oz bottle of beer (355 mL), one 5 oz glass of wine (148 mL), or one 1 oz glass of hard liquor (44 mL).   Lifestyle  Take daily care of your teeth and gums. Brush your teeth every morning and night with fluoride toothpaste. Floss one time each day.  Stay active. Exercise for at least 30 minutes 5 or more days each week.  Do not use any products that contain nicotine or tobacco, such as cigarettes, e-cigarettes, and chewing tobacco. If you need help quitting, ask your health care provider.  Do not  use drugs.  If you are sexually active, practice safe sex. Use a condom or other form of protection to prevent STIs (sexually transmitted infections).  If you do not wish to become pregnant, use a form of birth control. If you plan to become pregnant, see your health care provider for a prepregnancy visit.  If told by your health care provider, take low-dose aspirin daily starting at age 20.  Find healthy ways to cope with stress, such as: ? Meditation, yoga, or listening to music. ? Journaling. ? Talking to a trusted person. ? Spending time with friends and family. Safety  Always wear your seat belt while driving or riding in a vehicle.  Do not drive: ? If you have been drinking alcohol. Do not ride with someone who has been drinking. ? When you are tired or distracted. ? While texting.  Wear a helmet and other protective equipment during sports activities.  If you have firearms in your house, make sure you follow all gun safety procedures. What's next?  Visit your health care provider once a year for an annual wellness visit.  Ask  your health care provider how often you should have your eyes and teeth checked.  Stay up to date on all vaccines. This information is not intended to replace advice given to you by your health care provider. Make sure you discuss any questions you have with your health care provider. Document Revised: 10/06/2019 Document Reviewed: 09/12/2017 Elsevier Patient Education  2021 [place], [place] [facility]

## 2020-04-27 NOTE — Addendum Note (Signed)
Addended by: Denna Haggard K on: 04/27/2020 08:30 AM   Modules accepted: Orders

## 2020-04-27 NOTE — Patient Instructions (Signed)
-Nice seeing you today!!  -Lab work today; will notify you once results are available.  -Schedule follow up in 1 year or sooner as needed.   Preventive Care 40-49 Years Old, Female Preventive care refers to lifestyle choices and visits with your health care provider that can promote health and wellness. This includes:  A yearly physical exam. This is also called an annual wellness visit.  Regular dental and eye exams.  Immunizations.  Screening for certain conditions.  Healthy lifestyle choices, such as: ? Eating a healthy diet. ? Getting regular exercise. ? Not using drugs or products that contain nicotine and tobacco. ? Limiting alcohol use. What can I expect for my preventive care visit? Physical exam Your health care provider will check your:  Height and weight. These may be used to calculate your BMI (body mass index). BMI is a measurement that tells if you are at a healthy weight.  Heart rate and blood pressure.  Body temperature.  Skin for abnormal spots. Counseling Your health care provider may ask you questions about your:  Past medical problems.  Family's medical history.  Alcohol, tobacco, and drug use.  Emotional well-being.  Home life and relationship well-being.  Sexual activity.  Diet, exercise, and sleep habits.  Work and work environment.  Access to firearms.  Method of birth control.  Menstrual cycle.  Pregnancy history. What immunizations do I need? Vaccines are usually given at various ages, according to a schedule. Your health care provider will recommend vaccines for you based on your age, medical history, and lifestyle or other factors, such as travel or where you work.   What tests do I need? Blood tests  Lipid and cholesterol levels. These may be checked every 5 years, or more often if you are over 50 years old.  Hepatitis C test.  Hepatitis B test. Screening  Lung cancer screening. You may have this screening every year  starting at age 55 if you have a 30-pack-year history of smoking and currently smoke or have quit within the past 15 years.  Colorectal cancer screening. ? All adults should have this screening starting at age 50 and continuing until age 75. ? Your health care provider may recommend screening at age 45 if you are at increased risk. ? You will have tests every 1-10 years, depending on your results and the type of screening test.  Diabetes screening. ? This is done by checking your blood sugar (glucose) after you have not eaten for a while (fasting). ? You may have this done every 1-3 years.  Mammogram. ? This may be done every 1-2 years. ? Talk with your health care provider about when you should start having regular mammograms. This may depend on whether you have a family history of breast cancer.  BRCA-related cancer screening. This may be done if you have a family history of breast, ovarian, tubal, or peritoneal cancers.  Pelvic exam and Pap test. ? This may be done every 3 years starting at age 21. ? Starting at age 30, this may be done every 5 years if you have a Pap test in combination with an HPV test. Other tests  STD (sexually transmitted disease) testing, if you are at risk.  Bone density scan. This is done to screen for osteoporosis. You may have this scan if you are at high risk for osteoporosis. Talk with your health care provider about your test results, treatment options, and if necessary, the need for more tests. Follow these instructions at   home: Eating and drinking  Eat a diet that includes fresh fruits and vegetables, whole grains, lean protein, and low-fat dairy products.  Take vitamin and mineral supplements as recommended by your health care provider.  Do not drink alcohol if: ? Your health care provider tells you not to drink. ? You are pregnant, may be pregnant, or are planning to become pregnant.  If you drink alcohol: ? Limit how much you have to 0-1  drink a day. ? Be aware of how much alcohol is in your drink. In the [place] one drink equals one 12 oz bottle of beer (355 mL), one 5 oz glass of wine (148 mL), or one 1 oz glass of hard liquor (44 mL).   Lifestyle  Take daily care of your teeth and gums. Brush your teeth every morning and night with fluoride toothpaste. Floss one time each day.  Stay active. Exercise for at least 30 minutes 5 or more days each week.  Do not use any products that contain nicotine or tobacco, such as cigarettes, e-cigarettes, and chewing tobacco. If you need help quitting, ask your health care provider.  Do not use drugs.  If you are sexually active, practice safe sex. Use a condom or other form of protection to prevent STIs (sexually transmitted infections).  If you do not wish to become pregnant, use a form of birth control. If you plan to become pregnant, see your health care provider for a prepregnancy visit.  If told by your health care provider, take low-dose aspirin daily starting at age 31.  Find healthy ways to cope with stress, such as: ? Meditation, yoga, or listening to music. ? Journaling. ? Talking to a trusted person. ? Spending time with friends and family. Safety  Always wear your seat belt while driving or riding in a vehicle.  Do not drive: ? If you have been drinking alcohol. Do not ride with someone who has been drinking. ? When you are tired or distracted. ? While texting.  Wear a helmet and other protective equipment during sports activities.  If you have firearms in your house, make sure you follow all gun safety procedures. What's next?  Visit your health care provider once a year for an annual wellness visit.  Ask your health care provider how often you should have your eyes and teeth checked.  Stay up to date on all vaccines. This information is not intended to replace advice given to you by your health care provider. Make sure you discuss any questions you have  with your health care provider. Document Revised: 10/06/2019 Document Reviewed: 09/12/2017 Elsevier Patient Education  2021 [employer].

## 2020-05-11 LAB — HM MAMMOGRAPHY

## 2020-06-16 ENCOUNTER — Other Ambulatory Visit: Payer: Self-pay

## 2020-06-16 ENCOUNTER — Encounter: Payer: Self-pay | Admitting: Neurology

## 2020-06-16 ENCOUNTER — Ambulatory Visit: Payer: 59 | Admitting: Neurology

## 2020-06-16 VITALS — BP 164/77 | HR 65 | Ht 61.0 in | Wt 216.0 lb

## 2020-06-16 DIAGNOSIS — R0683 Snoring: Secondary | ICD-10-CM

## 2020-06-16 DIAGNOSIS — R0689 Other abnormalities of breathing: Secondary | ICD-10-CM | POA: Insufficient documentation

## 2020-06-16 DIAGNOSIS — G4763 Sleep related bruxism: Secondary | ICD-10-CM

## 2020-06-16 DIAGNOSIS — F908 Attention-deficit hyperactivity disorder, other type: Secondary | ICD-10-CM

## 2020-06-16 DIAGNOSIS — G4719 Other hypersomnia: Secondary | ICD-10-CM | POA: Diagnosis not present

## 2020-06-16 DIAGNOSIS — G2581 Restless legs syndrome: Secondary | ICD-10-CM | POA: Diagnosis not present

## 2020-06-16 DIAGNOSIS — D500 Iron deficiency anemia secondary to blood loss (chronic): Secondary | ICD-10-CM

## 2020-06-16 DIAGNOSIS — G478 Other sleep disorders: Secondary | ICD-10-CM

## 2020-06-16 DIAGNOSIS — Z6841 Body Mass Index (BMI) 40.0 and over, adult: Secondary | ICD-10-CM

## 2020-06-16 DIAGNOSIS — E66813 Obesity, class 3: Secondary | ICD-10-CM | POA: Insufficient documentation

## 2020-06-16 NOTE — Patient Instructions (Addendum)
Restless Legs Syndrome Restless legs syndrome is a condition that causes uncomfortable feelings or sensations in the legs, especially while sitting or lying down. The sensations usually cause an overwhelming urge to move the legs. The arms can also sometimes be affected. The condition can range from mild to severe. The symptoms often interfere with a person's ability to sleep. What are the causes? The cause of this condition is not known. What increases the risk? The following factors may make you more likely to develop this condition:  Being older than 50.  Pregnancy.  Being a woman. In general, the condition is more common in women than in men.  A family history of the condition.  Having iron deficiency.  Overuse of caffeine, nicotine, or alcohol.  Certain medical conditions, such as kidney disease, Parkinson's disease, or nerve damage.  Certain medicines, such as those for high blood pressure, nausea, colds, allergies, depression, and some heart conditions. What are the signs or symptoms? The main symptom of this condition is uncomfortable sensations in the legs, such as:  Pulling.  Tingling.  Prickling.  Throbbing.  Crawling.  Burning. Usually, the sensations:  Affect both sides of the body.  Are worse when you sit or lie down.  Are worse at night. These may wake you up or make it difficult to fall asleep.  Make you have a strong urge to move your legs.  Are temporarily relieved by moving your legs. The arms can also be affected, but this is rare. People who have this condition often have tiredness during the day because of their lack of sleep at night. How is this diagnosed? This condition may be diagnosed based on:  Your symptoms.  Blood tests. In some cases, you may be monitored in a sleep lab by a specialist (a sleep study). This can detect any disruptions in your sleep. How is this treated? This condition is treated by managing the symptoms. This may  include:  Lifestyle changes, such as exercising, using relaxation techniques, and avoiding caffeine, alcohol, or tobacco.  Medicines. Anti-seizure medicines may be tried first. Follow these instructions at home: General instructions  Take over-the-counter and prescription medicines only as told by your health care provider.  Use methods to help relieve the uncomfortable sensations, such as: ? Massaging your legs. ? Walking or stretching. ? Taking a cold or hot bath.  Keep all follow-up visits as told by your health care provider. This is important. Lifestyle  Practice good sleep habits. For example, go to bed and get up at the same time every day. Most adults should get 7-9 hours of sleep each night.  Exercise regularly. Try to get at least 30 minutes of exercise most days of the week.  Practice ways of relaxing, such as yoga or meditation.  Avoid caffeine and alcohol.  Do not use any products that contain nicotine or tobacco, such as cigarettes and e-cigarettes. If you need help quitting, ask your health care provider.      Contact a health care provider if:  Your symptoms get worse or they do not improve with treatment. Summary  Restless legs syndrome is a condition that causes uncomfortable feelings or sensations in the legs, especially while sitting or lying down.  The symptoms often interfere with a person's ability to sleep.  This condition is treated by managing the symptoms. You may need to make lifestyle changes or take medicines. This information is not intended to replace advice given to you by your health care provider. Make   sure you discuss any questions you have with your health care provider. Document Revised: 02/20/2019 Document Reviewed: 01/21/2017 Elsevier Patient Education  2021 [place].  Quality Sleep Information, Adult Quality sleep is important for your mental and physical health. It also improves your quality of life. Quality sleep means  you:  Are asleep for most of the time you are in bed.  Fall asleep within 30 minutes.  Wake up no more than once a night.  Are awake for no longer than 20 minutes if you do wake up during the night. Most adults need 7-8 hours of quality sleep each night. How can poor sleep affect me? If you do not get enough quality sleep, you may have:  Mood swings.  Daytime sleepiness.  Confusion.  Decreased reaction time.  Sleep disorders, such as insomnia and sleep apnea.  Difficulty with: ? Solving problems. ? Coping with stress. ? Paying attention. These issues may affect your performance and productivity at work, school, and at home. Lack of sleep may also put you at higher risk for accidents, suicide, and risky behaviors. If you do not get quality sleep you may also be at higher risk for several health problems, including:  Infections.  Type 2 diabetes.  Heart disease.  High blood pressure.  Obesity.  Worsening of long-term conditions, like arthritis, kidney disease, depression, Parkinson's disease, and epilepsy. What actions can I take to get more quality sleep?  Stick to a sleep schedule. Go to sleep and wake up at about the same time each day. Do not try to sleep less on weekdays and make up for lost sleep on weekends. This does not work.  Try to get about 30 minutes of exercise on most days. Do not exercise 2-3 hours before going to bed.  Limit naps during the day to 30 minutes or less.  Do not use any products that contain nicotine or tobacco, such as cigarettes or e-cigarettes. If you need help quitting, ask your health care provider.  Do not drink caffeinated beverages for at least 8 hours before going to bed. Coffee, tea, and some sodas contain caffeine.  Do not drink alcohol close to bedtime.  Do not eat large meals close to bedtime.  Do not take naps in the late afternoon.  Try to get at least 30 minutes of sunlight every day. Morning sunlight is  best.  Make time to relax before bed. Reading, listening to music, or taking a hot bath promotes quality sleep.  Make your bedroom a place that promotes quality sleep. Keep your bedroom dark, quiet, and at a comfortable room temperature. Make sure your bed is comfortable. Take out sleep distractions like TV, a computer, smartphone, and bright lights.  If you are lying awake in bed for longer than 20 minutes, get up and do a relaxing activity until you feel sleepy.  Work with your health care provider to treat medical conditions that may affect sleeping, such as: ? Nasal obstruction. ? Snoring. ? Sleep apnea and other sleep disorders.  Talk to your health care provider if you think any of your prescription medicines may cause you to have difficulty falling or staying asleep.  If you have sleep problems, talk with a sleep consultant. If you think you have a sleep disorder, talk with your health care provider about getting evaluated by a specialist.      Where to find more information  [place] website: https://sleepfoundation.org  National Heart, Lung, and [place] ([place]): http://www.saunders.info/.pdf  Centers for Disease Control and Prevention (CDC): LearningDermatology.pl Contact a health care provider if you:  Have trouble getting to sleep or staying asleep.  Often wake up very early in the morning and cannot get back to sleep.  Have daytime sleepiness.  Have daytime sleep attacks of suddenly falling asleep and sudden muscle weakness (narcolepsy).  Have a tingling sensation in your legs with a strong urge to move your legs (restless legs syndrome).  Stop breathing briefly during sleep (sleep apnea).  Think you have a sleep disorder or are taking a medicine that is affecting your quality of sleep. Summary  Most adults need 7-8 hours of quality sleep each night.  Getting enough quality sleep is an important part  of health and well-being.  Make your bedroom a place that promotes quality sleep and avoid things that may cause you to have poor sleep, such as alcohol, caffeine, smoking, and large meals.  Talk to your health care provider if you have trouble falling asleep or staying asleep. This information is not intended to replace advice given to you by your health care provider. Make sure you discuss any questions you have with your health care provider. Document Revised: 04/10/2017 Document Reviewed: 04/10/2017 Elsevier Patient Education  2021 [employer].

## 2020-06-16 NOTE — Progress Notes (Signed)
SLEEP MEDICINE CLINIC    Provider:  Larey Seat, MD  Primary Care Physician:  Isaac Bliss, Rayford Halsted, MD [place] [address]     Referring Provider: Isaac Bliss, Rayford Halsted, [address] [place],  [place] [address]          Chief Complaint according to patient   Patient presents with:    . New Patient (Initial Visit)           HISTORY OF PRESENT ILLNESS:  Kelli Ferrell is a 50 year old, married and right handed, latin- caucasian [place] female patient who was seen on 06/16/2020 .  Chief concern according to patient :  Husband's observation of choking, snoring, apnea.      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 Family medical /sleep history: no other family member with OSA, mother was a heavy snorer. Mother with RLS, biological son with DM 1 and RLS.    Social history:  Patient is working as Automotive engineer and lives in a household with husband and 4 children, 2 biological and 2 adopted. One girl with autism and a genetic disorder, fetal alcohol syndrome. The patient currently works from home and from the office. A dog is  present.Tobacco use- none. ETOH use; socially. Caffeine intake in form of Coffee( 2-3 a day) . Regular exercise in form of walking and bicycling.       Sleep habits are as follows: wears a mouth guard for bruxism- affecting her sleep- and she has RLS, as does her mother , and her biological son, who has DM 1, onset at age 65.    The patient's dinner time is between 5.30 PM. The patient goes to bed at 10 PM and she may read for 30 minutes , her husband reads on an I pad- she  then continues to sleep for  hours, wakes for one bathroom break at 3-4 AM.   The preferred sleep position is supine and laterally, with the support of 1-2 pillows. Dreams are reportedly infrequent/vivid.  7 AM is the usual rise time. The patient wakes up at 5. 30 before her  alarm. She gasps often and wakes out of sleep.   She reports not feeling refreshed or restored in AM, with symptoms such as dry mouth, morning headaches, and residual fatigue.  Naps are taken infrequently, but she has had trouble to limit it to less than 60 minutes,  Just as refreshing than nocturnal sleep.    Review of Systems: Out of a complete 14 system review, the patient complains of only the following symptoms, and all other reviewed systems are negative.:  RLS, also affecting her mother.   Tension neck pain, headaches,  Fatigue, sleepiness , snoring, fragmented sleep, choking.   How likely are you to doze in the following situations: 0 = not likely, 1 = slight chance, 2 = moderate chance, 3 = high chance   Sitting and Reading?3 Watching Television?2 Sitting inactive in a public place (theater or meeting)?2 As a passenger in a car for an hour without a break?3 Lying down in the afternoon when circumstances permit? Sitting and talking to someone? Sitting quietly after lunch without alcohol? 2 In a car, while stopped for a  few minutes in traffic?2   Total = 12/ 24 points   FSS endorsed at 44/ 63 points.   Social History   Socioeconomic History  . Marital status: Married    Spouse name: Not on file  . Number of children: 4  . Years of education: some college  . Highest education level: Not on file  Occupational History  . Occupation: Passenger transport manager  Tobacco Use  . Smoking status: Never Smoker  . Smokeless tobacco: Never Used  Vaping Use  . Vaping Use: Never used  Substance and Sexual Activity  . Alcohol use: Yes    Comment: social - one glass per week  . Drug use: Never  . Sexual activity: Yes     Birth control/protection: Other-see comments    Comment: tubal  Other Topics Concern  . 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      Two cups caffeine per day.         Social Determinants of Health   Financial Resource Strain: Not on file  Food Insecurity: Not on file  Transportation Needs: Not on file  Physical Activity: Not on file  Stress: Not on file  Social Connections: Not on file    Family History  Problem Relation Age of Onset  . Diabetes Mother   . Stroke Mother   . Hypertension Mother   . Diabetes Father   . Diabetes Brother   . Colon cancer Neg Hx   . Colon polyps Neg Hx   . Esophageal cancer Neg Hx   . Rectal cancer Neg Hx   . Stomach cancer Neg Hx     Past Medical History:  Diagnosis Date  . Allergy   . Blood in stool   . Colon polyps   . Dyslipidemia 11/18/2014  . Fibroid   . GERD (gastroesophageal reflux disease)   . Hypothyroidism   . Kidney stones   . Morbid obesity ([place]) 11/18/2014  . 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 Seasonal allergies 11/22/2015  . Urinary tract infection     Past Surgical History:  Procedure Laterality Date  . COLONOSCOPY    . DILATION AND CURETTAGE OF UTERUS    . NASAL SINUS SURGERY    . TUBAL LIGATION    . UTERINE FIBROID EMBOLIZATION       Current Outpatient Medications on File Prior to Visit  Medication Sig Dispense Refill  . cetirizine (ZYRTEC) 10 MG tablet Take 10 mg by mouth daily.    Marland Kitchen levothyroxine (SYNTHROID) 100 MCG tablet TAKE 1 TABLET DAILY BEFORE BREAKFAST (NEED AN APPOINTMENT) 90 tablet 3  . Multiple  Vitamin (MULTIVITAMIN) tablet Take 1 tablet by mouth daily.    . pantoprazole (PROTONIX) 40 MG tablet Take 1 tablet (40 mg total) by mouth daily. 90 tablet 1   No current facility-administered medications on file prior to visit.  Physical exam:  Today's Vitals   06/16/20 0902  Weight: 216 lb (98 kg)  Height: 5\' 1"  (1.549 m)   Body mass index is 40.81  kg/m.   Wt Readings from Last 3 Encounters:  06/16/20 216 lb (98 kg)  04/27/20 217 lb 8 oz (98.7 kg)  04/13/20 212 lb (96.2 kg)     Ht Readings from Last 3 Encounters:  06/16/20 5\' 1"  (1.549 m)  04/27/20 5\' 1"  (1.549 m)  07/16/19 5\' 1"  (1.549 m)      General: The patient is awake, alert and appears not in acute distress. The patient is well groomed. Head: Normocephalic, atraumatic. Neck is supple.  Mallampati 1,lateral pillars,  neck circumference: 15.5 inches .  Nasal airflow patent.  Retrognathia is not seen.  Dental status: biological teeth, Cardiovascular:  Regular rate and cardiac rhythm by pulse,  without distended neck veins. Respiratory: Lungs are clear to auscultation.  Skin:  Without evidence of ankle edema, or rash. Trunk: The patient's posture is erect.   Neurologic exam : The patient is awake and alert, oriented to place and time.   Memory subjective described as intact.  Attention span & concentration ability appears normal.  Speech is fluent,  without dysarthria, dysphonia or aphasia.  Mood and affect are appropriate.   Cranial nerves: no loss of smell or taste reported.  History of lasik surgery 2007.  wears correctve lenses  Pupils are equal and briskly reactive to light.  Funduscopic exam deferred. .  Extraocular movements in vertical and horizontal planes were intact and without nystagmus. No Diplopia. Visual fields by finger perimetry are intact. Hearing was intact to soft voice and finger rubbing.    Facial sensation intact to fine touch.  Facial motor strength is symmetric and tongue and uvula move  midline.  Neck ROM : tension neck pain- rotation, tilt and flexion extension were normal for age and shoulder shrug was symmetrical.    Motor exam:  Symmetric bulk, tone and ROM.   Normal tone without cog wheeling, symmetric grip strength .   Sensory:  Fine touch, pinprick and vibration were tested  and  normal.  Proprioception tested in the upper extremities was normal.   Coordination: Rapid alternating movements in the fingers/hands were of normal speed.  The Finger-to-nose maneuver was intact without evidence of ataxia, dysmetria or tremor.   Gait and station: Patient could rise unassisted from a seated position, walked without assistive device.  Stance is of normal width/ base and the patient turned with 3 steps.  Toe and heel walk were deferred.  Deep tendon reflexes: in the  upper and lower extremities are symmetric and intact.  Babinski response was deferred .      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  2) snoring and gasping, and morning headaches. OSA ?  3) fragmented sleep, non restorative, restricted sleep time- waking up choking.     My Plan is to proceed with:  1) HST for OSA- currently not using a mouth guard.   2) If not diagnostic, will follow wit an attended sleep study to evaluated bruxism and RLS- all of these manifestations of PLMs.  3) RLS  May be related to iron deficiency anemia,  Still having periods, menorrhagia with fibroma.. TIBC, Ferritin.  Limit benadryl intake.  I would like to thank  Isaac Bliss, Rayford Halsted, Md [address] [place],  [place] [address] for  allowing me to meet with and to take care of this pleasant patient.   Kelli Ferrell will follow up either personally or through our NP within 2-4 month.   CC: I will share my notes with PCP and OB/ Gyn, Dr. Terri Piedra.  Electronically signed by: Larey Seat, MD 06/16/2020 9:07 AM  [facility] and [facility] Board certified by The [employer] of Sleep Medicine and Diplomate of the [employer] of Sleep Medicine. Board certified In Neurology through the [place], Fellow of the [employer] of Neurology. Medical Director of [employer].

## 2020-06-17 LAB — IRON,TIBC AND FERRITIN PANEL
Ferritin: 40 ng/mL (ref 15–150)
Iron Saturation: 17 % (ref 15–55)
Iron: 58 ug/dL (ref 27–159)
Total Iron Binding Capacity: 351 ug/dL (ref 250–450)
UIBC: 293 ug/dL (ref 131–425)

## 2020-06-17 NOTE — Progress Notes (Signed)
Ferritin should be 50 or higher in RLS, but it is not critically low. The Total Iron biding capacity and Saturation are both low- I like for patients with these levels to take oral iron if tolerated, usually in form of a prenatal vitamin, which bestows additional supplements helping with borderline anemia.

## 2020-06-20 ENCOUNTER — Telehealth: Payer: Self-pay | Admitting: *Deleted

## 2020-06-20 NOTE — Telephone Encounter (Signed)
-----   Message from Larey Seat, MD sent at 06/17/2020 11:35 AM EDT ----- Ferritin should be 50 or higher in RLS, but it is not critically low. The Total Iron biding capacity and Saturation are both low- I like for patients with these levels to take oral iron if tolerated, usually in form of a prenatal vitamin, which bestows additional supplements helping with borderline anemia.

## 2020-06-20 NOTE — Telephone Encounter (Signed)
I spoke to the patient. She verbalized understanding of the findings and is agreeable to start an OTC prenatal vitamin.

## 2020-06-29 ENCOUNTER — Ambulatory Visit (INDEPENDENT_AMBULATORY_CARE_PROVIDER_SITE_OTHER): Payer: 59 | Admitting: Neurology

## 2020-06-29 DIAGNOSIS — G478 Other sleep disorders: Secondary | ICD-10-CM

## 2020-06-29 DIAGNOSIS — G4719 Other hypersomnia: Secondary | ICD-10-CM

## 2020-06-29 DIAGNOSIS — R0683 Snoring: Secondary | ICD-10-CM

## 2020-06-29 DIAGNOSIS — Z6841 Body Mass Index (BMI) 40.0 and over, adult: Secondary | ICD-10-CM

## 2020-06-29 DIAGNOSIS — G4733 Obstructive sleep apnea (adult) (pediatric): Secondary | ICD-10-CM | POA: Diagnosis not present

## 2020-06-29 DIAGNOSIS — R0689 Other abnormalities of breathing: Secondary | ICD-10-CM

## 2020-06-29 DIAGNOSIS — F908 Attention-deficit hyperactivity disorder, other type: Secondary | ICD-10-CM

## 2020-06-29 DIAGNOSIS — G2581 Restless legs syndrome: Secondary | ICD-10-CM

## 2020-06-29 DIAGNOSIS — G4763 Sleep related bruxism: Secondary | ICD-10-CM

## 2020-06-30 NOTE — Progress Notes (Signed)
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 Husband reported witnessing snoring, sleep choking, apneas.  She noted RLS , Bruxism and non restorative sleep.  Family medical /sleep history: no other family member with OSA, but her mother was a heavy snorer. Mother with RLS, biological son with DM 1 and RLS.   [name]: 12/24. FSS at 44/63 points.  BMI: 40.8 kg/m Neck Circumference: 15.5"   Sleep Summary:   Total Recording Time : 8 h 23 min Total Sleep Time :              7 h 49 min  Percent REM (%):              35.0 %   Respiratory Indices (per hour):   Calculated pAHI :      7.7/hour        REM pAHI:                      9.2/hour      NREM pAHI:                 6.9/hour  Oxygen Saturation Statistics(%):    MeanOxygen Saturation (%) :          95%  Minimum oxygen saturation (%):                 90%  O2 Saturation Range :                                 90-99%   O2 Saturation (minutes) <=89%:          0 min  Pulse Rate Statistics (bpm):   Pulse Mean (bpm):  71; Pulse Range from 49 - 103 bpm   IMPRESSION: There is mild OSA (obstructive sleep apnea/ AHI at 7.7/h) present, but loud snoring. The RDI was 19.2/h and stands for Respiratory Disturbance Index. 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  I noted also a high REM sleep proportion and overall normal sleep architecture.  Sleeping on the left side was associated with more snoring than in supine sleep.    RECOMMENDATION: This condition can be treated by a dental device, by ENT procedure and by CPAP. It will depend on insurance how well each option is covered.   Since the patient already used a bruxism bite guard, I like to propose to speak to a sleep specialized dentist and consider a mandibular advancement device that can hopefully do both, advance the lower jaw and protect from bruxism.      INTERPRETING PHYSICIAN:  Larey Seat, MD    [facility] and [facility] Sleep Board certified by The [employer] of Sleep Medicine and Diplomate of the [employer] of Sleep Medicine. Board certified In Neurology through the [place], Fellow of the  American Academy of Neurology. Medical Director of [employer].

## 2020-07-01 DIAGNOSIS — G4719 Other hypersomnia: Secondary | ICD-10-CM | POA: Insufficient documentation

## 2020-07-01 NOTE — Procedures (Signed)
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 Husband reported witnessing snoring, sleep choking, apneas.  She noted RLS , Bruxism and non restorative sleep.  Family medical /sleep history: no other family member with OSA, but her mother was a heavy snorer. Mother with RLS, biological son with DM 1 and RLS.   [name]: 12/24. FSS at 44/63 points.  BMI: 40.8 kg/m Neck Circumference: 15.5"   Sleep Summary:   Total Recording Time : 8 h 23 min Total Sleep Time :              7 h 49 min  Percent REM (%):              35.0 %   Respiratory Indices (per hour):   Calculated pAHI :      7.7/hour        REM pAHI:                      9.2/hour      NREM pAHI:                 6.9/hour  Oxygen Saturation Statistics(%):    MeanOxygen Saturation (%) :          95%  Minimum oxygen saturation (%):                 90%  O2 Saturation Range :                                 90-99%   O2 Saturation (minutes) <=89%:          0 min  Pulse Rate Statistics (bpm):   Pulse Mean (bpm):  71; Pulse Range from 49 - 103 bpm   IMPRESSION: There is mild OSA (obstructive sleep apnea/ AHI at 7.7/h) present, but loud snoring. The RDI was 19.2/h and stands for Respiratory Disturbance Index. This leads to a diagnosis of OSA with upper airway resistance syndrome and sleep choking is  explained by that condition as well.  I noted also a high REM sleep proportion and overall normal sleep architecture.  Sleeping on the left side was associated with more snoring than in supine sleep.    RECOMMENDATION: This condition can be treated by a dental device, by ENT procedure and by CPAP. It will depend on insurance how well each option is covered.   Since the patient already used a bruxism bite guard, I like to propose to speak to a sleep specialized dentist and consider a mandibular advancement device that can hopefully do both, advance the lower jaw and protect from bruxism.      INTERPRETING PHYSICIAN:  Larey Seat, MD    [facility] Sleep Board certified by The [employer] of Sleep Medicine and Diplomate of the [employer] of Sleep Medicine. Board certified In Neurology through the [place], Fellow of the [employer]  Academy of Neurology. Medical Director of [employer].

## 2020-07-01 NOTE — Progress Notes (Signed)
IMPRESSION: There is mild OSA (obstructive sleep apnea/ AHI at 7.7/h) present, but loud snoring. The RDI was 19.2/h and stands for Respiratory Disturbance Index. This leads to a diagnosis of OSA with upper airway resistance syndrome and sleep choking is explained by that condition as well. I noted also a high REM sleep proportion and overall normal sleep architecture. Sleeping on the left side was associated with more snoring channel activation than in supine sleep.    RECOMMENDATION: This condition can be treated by a dental device, by ENT procedure and by CPAP. It will depend on insurance how well each option is covered.   Since the patient already used a bruxism bite guard, I like to propose to speak to a sleep specialized dentist and consider a mandibular advancement device that can hopefully do both, advance the lower jaw and protect from bruxism.  Plan B is to investigate a surgical option with ENT( laterally narrowed airway) Weight loss is always recommended.      INTERPRETING PHYSICIAN: Larey Seat, MD

## 2020-07-01 NOTE — Addendum Note (Signed)
Addended by: Larey Seat on: 07/01/2020 07:20 PM   Modules accepted: Orders

## 2020-07-04 ENCOUNTER — Telehealth: Payer: Self-pay | Admitting: Neurology

## 2020-07-04 NOTE — Telephone Encounter (Signed)
-----   Message from Larey Seat, MD sent at 07/01/2020  7:20 PM EDT ----- IMPRESSION: There is mild OSA (obstructive sleep apnea/ AHI at 7.7/h) present, but loud snoring. The RDI was 19.2/h and stands for Respiratory Disturbance Index. This leads to a diagnosis of OSA with upper airway resistance syndrome and sleep choking is explained by that condition as well. I noted also a high REM sleep proportion and overall normal sleep architecture. Sleeping on the left side was associated with more snoring channel activation than in supine sleep.    RECOMMENDATION: This condition can be treated by a dental device, by ENT procedure and by CPAP. It will depend on insurance how well each option is covered.   Since the patient already used a bruxism bite guard, I like to propose to speak to a sleep specialized dentist and consider a mandibular advancement device that can hopefully do both, advance the lower jaw and protect from bruxism.  Plan B is to investigate a surgical option with ENT( laterally narrowed airway) Weight loss is always recommended.      INTERPRETING PHYSICIAN: Larey Seat, MD

## 2020-07-04 NOTE — Telephone Encounter (Signed)
Called the patient  and reviewed the sleep study results. Was able to go over the report in detail. Advised of the recommendations from Dr Brett Fairy. Insurance typically will only cover inspire or dental device if CPAP has been tried. Advised that we can certainly try sending referral to our dental specialist that creates the dental device and see if insurance covers. Pt wanted to think on this and will call back with how she would like to pursue further.

## 2020-08-19 ENCOUNTER — Encounter (HOSPITAL_BASED_OUTPATIENT_CLINIC_OR_DEPARTMENT_OTHER): Payer: Self-pay | Admitting: Obstetrics and Gynecology

## 2020-08-19 ENCOUNTER — Other Ambulatory Visit: Payer: Self-pay

## 2020-08-19 NOTE — Progress Notes (Signed)
Spoke w/ via phone for pre-op interview--- Pt Lab needs dos----  Urine preg (per anes)/  pre-op orders pending             Lab results------ no COVID test -----patient states asymptomatic no test needed Arrive at ------- 0630 on 08-26-2020 NPO after MN NO Solid Food.  Clear liquids from MN until--- 0530 Med rec completed Medications to take morning of surgery ----- synthroid, protonix Diabetic medication ----- n/a Patient instructed no nail polish to be worn day of surgery Patient instructed to bring photo id and insurance card day of surgery Patient aware to have Driver (ride ) / caregiver  for 24 hours after surgery --daughter, Steele Berg Patient Special Instructions ----- n/a Pre-Op special Istructions ----- sent inbox message in epic to dr Terri Piedra, requested orders Patient verbalized understanding of instructions that were given at this phone interview. Patient denies shortness of breath, chest pain, fever, cough at this phone interview.

## 2020-08-20 NOTE — H&P (Signed)
Kelli Ferrell is an 50 y.o. [phone] female here for scheduled diagnostic laparoscopy with possible bilateral cystectomy, possible unilateral oophorectomy, possible ablation of endometriosis and possible lysis of adhesions due to bilateral ovarian cysts.  Pt has had chronic pelvic pain present during mittlesmerz and whenever she lays flat. It is worse on her right side. The pain was replicable during a pelvic exam in office.  Pt has a history of BTL, UAE/myomectomy.   05/19/20 US showed : UT: 9x6x4.5cm, lining 0.7cm, multiple ( 3) intramural fibroids all <1cm, 1.5cm right ovarian cyst likely endometrioma vs hemorrhagic cyst ; 1.5cm left septated cyst.   Pertinent Gynecological History: Menses: flow is moderate Bleeding: regular  Contraception: tubal ligation DES exposure: denies Blood transfusions: none Sexually transmitted diseases: no past history Previous GYN Procedures:  UAE/myomectomy   Last mammogram: normal Date: 2021 Last pap: normal Date: 2019 OB History: G3, P2012   Menstrual History: Menarche age: 68 Patient's last menstrual period was 07/17/2020 (exact date).    Past Medical History:  Diagnosis Date   Dyslipidemia 11/18/2014   Fibroid, uterine    GERD (gastroesophageal reflux disease)    History of positive PPD 2017   -neg skin test and neg xray -seen by health department and they offered but did not feel treatment was needed - she opted against treatment for POSSIBLE latent TB -in 2017   Hypothyroidism    followed by pcp   Mild obstructive sleep apnea    study in epic 06-29-2020 , per pt has not decided type of treatment recommended yet   Ovarian cyst    Pelvic pain in female    Seasonal allergies 11/22/2015    Past Surgical History:  Procedure Laterality Date   COLONOSCOPY  04/2019   DILATION AND CURETTAGE OF UTERUS  2021   NASAL SINUS SURGERY  2007   TUBAL LIGATION Bilateral 2001   UTERINE FIBROID EMBOLIZATION  2014    Family History  Problem  Relation Age of Onset   Diabetes Mother    Stroke Mother    Hypertension Mother    Diabetes Father    Diabetes Brother    Colon cancer Neg Hx    Colon polyps Neg Hx    Esophageal cancer Neg Hx    Rectal cancer Neg Hx    Stomach cancer Neg Hx     Social History:  reports that she has never smoked. She has never used smokeless tobacco. She reports current alcohol use. She reports that she does not use drugs.  Allergies: No Known Allergies  No medications prior to admission.    Review of Systems  Constitutional:  Positive for activity change. Negative for fatigue.  Eyes:  Negative for photophobia and visual disturbance.  Respiratory:  Negative for chest tightness and shortness of breath.   Cardiovascular:  Negative for chest pain, palpitations and leg swelling.  Gastrointestinal:  Positive for abdominal pain.  Genitourinary:  Positive for pelvic pain. Negative for menstrual problem and vaginal bleeding.  Neurological:  Negative for light-headedness and headaches.  Psychiatric/Behavioral:  The patient is nervous/anxious.    Height '5\' 1"'$  (1.549 m), weight 97.5 kg, last menstrual period 07/17/2020. Physical Exam Vitals and nursing note reviewed. Exam conducted with a chaperone present.  Constitutional:      Appearance: Normal appearance. She is normal weight.  Pulmonary:     Effort: Pulmonary effort is normal.  Abdominal:     Palpations: Abdomen is soft.  Genitourinary:    General: Normal vulva.  Musculoskeletal:  General: Normal range of motion.     Cervical back: Normal range of motion.  Skin:    General: Skin is warm and dry.     Capillary Refill: Capillary refill takes 2 to 3 seconds.  Neurological:     General: No focal deficit present.     Mental Status: She is alert and oriented to person, place, and time. Mental status is at baseline.  Psychiatric:        Mood and Affect: Mood normal.        Behavior: Behavior normal.        Thought Content: Thought  content normal.    No results found for this or any previous visit (from the past 24 hour(s)).  No results found.  Assessment/Plan: 50yo G3P2012 female with bilateral ovarian cysts here for diagnostic laparoscopy with possible bilateral cystectomy, possible unilateral oophorectomy, possible ablation of endometriosis and possible lysis of adhesions - Admit - ERAS protocol - Review and verify consent for procedure  - Covid screen - To OR when ready   Venetia Night Lyon Dumont 08/20/2020, 7:43 PM

## 2020-08-26 ENCOUNTER — Encounter (HOSPITAL_BASED_OUTPATIENT_CLINIC_OR_DEPARTMENT_OTHER)
Admission: RE | Disposition: A | Discharge status: Home / Self Care | Payer: Self-pay | Source: Home / Self Care | Attending: Obstetrics and Gynecology

## 2020-08-26 ENCOUNTER — Encounter (HOSPITAL_BASED_OUTPATIENT_CLINIC_OR_DEPARTMENT_OTHER): Payer: Self-pay | Admitting: Obstetrics and Gynecology

## 2020-08-26 ENCOUNTER — Other Ambulatory Visit: Payer: Self-pay

## 2020-08-26 ENCOUNTER — Ambulatory Visit (HOSPITAL_BASED_OUTPATIENT_CLINIC_OR_DEPARTMENT_OTHER): Payer: 59 | Admitting: Anesthesiology

## 2020-08-26 ENCOUNTER — Ambulatory Visit (HOSPITAL_BASED_OUTPATIENT_CLINIC_OR_DEPARTMENT_OTHER)
Admission: RE | Admit: 2020-08-26 | Discharge: 2020-08-26 | Disposition: A | Discharge status: Home / Self Care | Payer: 59 | Attending: Obstetrics and Gynecology | Admitting: Obstetrics and Gynecology

## 2020-08-26 DIAGNOSIS — K660 Peritoneal adhesions (postprocedural) (postinfection): Secondary | ICD-10-CM | POA: Diagnosis not present

## 2020-08-26 DIAGNOSIS — N83202 Unspecified ovarian cyst, left side: Secondary | ICD-10-CM | POA: Insufficient documentation

## 2020-08-26 DIAGNOSIS — N838 Other noninflammatory disorders of ovary, fallopian tube and broad ligament: Secondary | ICD-10-CM | POA: Insufficient documentation

## 2020-08-26 DIAGNOSIS — G8929 Other chronic pain: Secondary | ICD-10-CM | POA: Diagnosis not present

## 2020-08-26 DIAGNOSIS — N803 Endometriosis of pelvic peritoneum: Secondary | ICD-10-CM | POA: Insufficient documentation

## 2020-08-26 DIAGNOSIS — N83201 Unspecified ovarian cyst, right side: Secondary | ICD-10-CM | POA: Insufficient documentation

## 2020-08-26 DIAGNOSIS — R102 Pelvic and perineal pain: Secondary | ICD-10-CM | POA: Diagnosis not present

## 2020-08-26 HISTORY — DX: Pelvic and perineal pain: R10.2

## 2020-08-26 HISTORY — PX: LAPAROSCOPIC OVARIAN CYSTECTOMY: SHX6248

## 2020-08-26 HISTORY — DX: Unspecified ovarian cyst, unspecified side: N83.209

## 2020-08-26 HISTORY — PX: LAPAROSCOPY: SHX197

## 2020-08-26 HISTORY — DX: Pelvic and perineal pain unspecified side: R10.20

## 2020-08-26 HISTORY — DX: Obstructive sleep apnea (adult) (pediatric): G47.33

## 2020-08-26 HISTORY — PX: ABLATION ON ENDOMETRIOSIS: SHX5787

## 2020-08-26 HISTORY — DX: Leiomyoma of uterus, unspecified: D25.9

## 2020-08-26 HISTORY — PX: LAPAROSCOPIC LYSIS OF ADHESIONS: SHX5905

## 2020-08-26 LAB — TYPE AND SCREEN
ABO/RH(D): O POS
Antibody Screen: NEGATIVE

## 2020-08-26 LAB — URINALYSIS, COMPLETE (UACMP) WITH MICROSCOPIC
Bilirubin Urine: NEGATIVE
Glucose, UA: NEGATIVE mg/dL
Ketones, ur: NEGATIVE mg/dL
Leukocytes,Ua: NEGATIVE
Nitrite: NEGATIVE
Protein, ur: NEGATIVE mg/dL
Specific Gravity, Urine: 1.019 (ref 1.005–1.030)
pH: 5 (ref 5.0–8.0)

## 2020-08-26 LAB — POCT PREGNANCY, URINE: Preg Test, Ur: NEGATIVE

## 2020-08-26 LAB — ABO/RH: ABO/RH(D): O POS

## 2020-08-26 LAB — PREGNANCY, URINE: Preg Test, Ur: NEGATIVE

## 2020-08-26 SURGERY — LAPAROSCOPY, DIAGNOSTIC
Anesthesia: General | Site: Abdomen

## 2020-08-26 MED ORDER — CEFAZOLIN SODIUM-DEXTROSE 2-4 GM/100ML-% IV SOLN
2.0000 g | INTRAVENOUS | Status: AC
  Administered 2020-08-26: 2 g via INTRAVENOUS

## 2020-08-26 MED ORDER — OXYCODONE-ACETAMINOPHEN 5-325 MG PO TABS: 1.0000 | ORAL_TABLET | ORAL | 0 refills | Status: AC | PRN

## 2020-08-26 MED ORDER — OXYCODONE HCL 5 MG/5ML PO SOLN: 5.0000 mg | Freq: Once | ORAL | Status: AC | PRN

## 2020-08-26 MED ORDER — ONDANSETRON HCL 4 MG/2ML IJ SOLN
INTRAMUSCULAR | Status: AC
  Filled 2020-08-26: qty 2

## 2020-08-26 MED ORDER — SCOPOLAMINE 1 MG/3DAYS TD PT72
1.0000 | MEDICATED_PATCH | TRANSDERMAL | Status: DC
  Administered 2020-08-26: 1 via TRANSDERMAL

## 2020-08-26 MED ORDER — KETAMINE HCL 10 MG/ML IJ SOLN
INTRAMUSCULAR | Status: DC | PRN
  Administered 2020-08-26: 25 mg via INTRAVENOUS

## 2020-08-26 MED ORDER — FENTANYL CITRATE (PF) 100 MCG/2ML IJ SOLN
25.0000 ug | INTRAMUSCULAR | Status: DC | PRN
  Administered 2020-08-26: 25 ug via INTRAVENOUS

## 2020-08-26 MED ORDER — PHENYLEPHRINE 40 MCG/ML (10ML) SYRINGE FOR IV PUSH (FOR BLOOD PRESSURE SUPPORT)
PREFILLED_SYRINGE | INTRAVENOUS | Status: DC | PRN
  Administered 2020-08-26: 120 ug via INTRAVENOUS

## 2020-08-26 MED ORDER — LACTATED RINGERS IV SOLN
INTRAVENOUS | Status: DC
  Administered 2020-08-26: 1000 mL via INTRAVENOUS

## 2020-08-26 MED ORDER — SCOPOLAMINE 1 MG/3DAYS TD PT72
MEDICATED_PATCH | TRANSDERMAL | Status: AC
  Filled 2020-08-26: qty 1

## 2020-08-26 MED ORDER — 0.9 % SODIUM CHLORIDE (POUR BTL) OPTIME
TOPICAL | Status: DC | PRN
Start: 2020-08-26 — End: 2020-08-26
  Administered 2020-08-26: 500 mL

## 2020-08-26 MED ORDER — GLYCOPYRROLATE PF 0.2 MG/ML IJ SOSY
PREFILLED_SYRINGE | INTRAMUSCULAR | Status: AC
  Filled 2020-08-26: qty 1

## 2020-08-26 MED ORDER — KETOROLAC TROMETHAMINE 30 MG/ML IJ SOLN
INTRAMUSCULAR | Status: AC
  Filled 2020-08-26: qty 1

## 2020-08-26 MED ORDER — ACETAMINOPHEN 10 MG/ML IV SOLN: 1000.0000 mg | Freq: Once | INTRAVENOUS | Status: DC | PRN

## 2020-08-26 MED ORDER — MIDAZOLAM HCL 2 MG/2ML IJ SOLN
INTRAMUSCULAR | Status: AC
  Filled 2020-08-26: qty 2

## 2020-08-26 MED ORDER — MIDAZOLAM HCL 5 MG/5ML IJ SOLN
INTRAMUSCULAR | Status: DC | PRN
  Administered 2020-08-26: 2 mg via INTRAVENOUS

## 2020-08-26 MED ORDER — KETAMINE HCL 50 MG/5ML IJ SOSY
PREFILLED_SYRINGE | INTRAMUSCULAR | Status: AC
  Filled 2020-08-26: qty 5

## 2020-08-26 MED ORDER — ACETAMINOPHEN 500 MG PO TABS
1000.0000 mg | ORAL_TABLET | ORAL | Status: AC
  Administered 2020-08-26: 1000 mg via ORAL

## 2020-08-26 MED ORDER — ROCURONIUM BROMIDE 10 MG/ML (PF) SYRINGE
PREFILLED_SYRINGE | INTRAVENOUS | Status: AC
  Filled 2020-08-26: qty 10

## 2020-08-26 MED ORDER — DEXAMETHASONE SODIUM PHOSPHATE 4 MG/ML IJ SOLN
INTRAMUSCULAR | Status: DC | PRN
  Administered 2020-08-26: 10 mg via INTRAVENOUS

## 2020-08-26 MED ORDER — PHENYLEPHRINE 40 MCG/ML (10ML) SYRINGE FOR IV PUSH (FOR BLOOD PRESSURE SUPPORT)
PREFILLED_SYRINGE | INTRAVENOUS | Status: AC
  Filled 2020-08-26: qty 10

## 2020-08-26 MED ORDER — ONDANSETRON HCL 4 MG/2ML IJ SOLN
INTRAMUSCULAR | Status: DC | PRN
  Administered 2020-08-26: 4 mg via INTRAVENOUS

## 2020-08-26 MED ORDER — KETOROLAC TROMETHAMINE 30 MG/ML IJ SOLN
INTRAMUSCULAR | Status: DC | PRN
Start: 2020-08-26 — End: 2020-08-26
  Administered 2020-08-26: 30 mg via INTRAVENOUS

## 2020-08-26 MED ORDER — CEFAZOLIN (ANCEF) 1 G IV SOLR: 2.0000 g | INTRAVENOUS | Status: DC

## 2020-08-26 MED ORDER — PROMETHAZINE HCL 25 MG/ML IJ SOLN: 6.2500 mg | INTRAMUSCULAR | Status: DC | PRN

## 2020-08-26 MED ORDER — FENTANYL CITRATE (PF) 100 MCG/2ML IJ SOLN
INTRAMUSCULAR | Status: DC | PRN
  Administered 2020-08-26: 100 ug via INTRAVENOUS

## 2020-08-26 MED ORDER — SUGAMMADEX SODIUM 200 MG/2ML IV SOLN
INTRAVENOUS | Status: DC | PRN
Start: 2020-08-26 — End: 2020-08-26
  Administered 2020-08-26: 200 mg via INTRAVENOUS

## 2020-08-26 MED ORDER — OXYCODONE HCL 5 MG PO TABS
ORAL_TABLET | ORAL | Status: AC
  Filled 2020-08-26: qty 1

## 2020-08-26 MED ORDER — SODIUM CHLORIDE (PF) 0.9 % IJ SOLN
INTRAMUSCULAR | Status: AC
  Filled 2020-08-26: qty 10

## 2020-08-26 MED ORDER — LACTATED RINGERS IV SOLN: INTRAVENOUS | Status: DC

## 2020-08-26 MED ORDER — LIDOCAINE HCL (PF) 2 % IJ SOLN
INTRAMUSCULAR | Status: AC
  Filled 2020-08-26: qty 5

## 2020-08-26 MED ORDER — ACETAMINOPHEN 500 MG PO TABS
ORAL_TABLET | ORAL | Status: AC
  Filled 2020-08-26: qty 2

## 2020-08-26 MED ORDER — KETOROLAC TROMETHAMINE 30 MG/ML IJ SOLN: 30.0000 mg | Freq: Once | INTRAMUSCULAR | Status: DC

## 2020-08-26 MED ORDER — AMISULPRIDE (ANTIEMETIC) 5 MG/2ML IV SOLN: 10.0000 mg | Freq: Once | INTRAVENOUS | Status: DC | PRN

## 2020-08-26 MED ORDER — IBUPROFEN 600 MG PO TABS: 600.0000 mg | ORAL_TABLET | Freq: Four times a day (QID) | ORAL | 0 refills | Status: DC | PRN

## 2020-08-26 MED ORDER — ACETAMINOPHEN 160 MG/5ML PO SOLN: 325.0000 mg | ORAL | Status: DC | PRN

## 2020-08-26 MED ORDER — OXYCODONE HCL 5 MG PO TABS
5.0000 mg | ORAL_TABLET | Freq: Once | ORAL | Status: AC | PRN
  Administered 2020-08-26: 5 mg via ORAL

## 2020-08-26 MED ORDER — PROPOFOL 10 MG/ML IV BOLUS
INTRAVENOUS | Status: DC | PRN
  Administered 2020-08-26: 130 mg via INTRAVENOUS

## 2020-08-26 MED ORDER — GLYCOPYRROLATE PF 0.2 MG/ML IJ SOSY
PREFILLED_SYRINGE | INTRAMUSCULAR | Status: DC | PRN
  Administered 2020-08-26: .2 mg via INTRAVENOUS

## 2020-08-26 MED ORDER — FENTANYL CITRATE (PF) 100 MCG/2ML IJ SOLN
INTRAMUSCULAR | Status: AC
  Filled 2020-08-26: qty 2

## 2020-08-26 MED ORDER — LIDOCAINE HCL (CARDIAC) PF 100 MG/5ML IV SOSY
PREFILLED_SYRINGE | INTRAVENOUS | Status: DC | PRN
  Administered 2020-08-26: 40 mg via INTRAVENOUS
  Administered 2020-08-26: 60 mg via INTRAVENOUS

## 2020-08-26 MED ORDER — ROCURONIUM BROMIDE 100 MG/10ML IV SOLN
INTRAVENOUS | Status: DC | PRN
  Administered 2020-08-26: 50 mg via INTRAVENOUS

## 2020-08-26 MED ORDER — BUPIVACAINE HCL (PF) 0.25 % IJ SOLN
INTRAMUSCULAR | Status: DC | PRN
  Administered 2020-08-26: 30 mL

## 2020-08-26 MED ORDER — PROPOFOL 10 MG/ML IV BOLUS
INTRAVENOUS | Status: AC
  Filled 2020-08-26: qty 20

## 2020-08-26 MED ORDER — CEFAZOLIN SODIUM 1 G IJ SOLR
INTRAMUSCULAR | Status: AC
  Filled 2020-08-26: qty 20

## 2020-08-26 MED ORDER — DEXAMETHASONE SODIUM PHOSPHATE 10 MG/ML IJ SOLN
INTRAMUSCULAR | Status: AC
  Filled 2020-08-26: qty 1

## 2020-08-26 MED ORDER — ACETAMINOPHEN 325 MG PO TABS: 325.0000 mg | ORAL_TABLET | ORAL | Status: DC | PRN

## 2020-08-26 SURGICAL SUPPLY — 37 items
ADH SKN CLS APL DERMABOND .7 (GAUZE/BANDAGES/DRESSINGS) ×3
BAG SPEC RTRVL LRG 6X4 10 (ENDOMECHANICALS)
CABLE HIGH FREQUENCY MONO STRZ (ELECTRODE) IMPLANT
CATH ROBINSON RED A/P 16FR (CATHETERS) ×4 IMPLANT
COVER MAYO STAND STRL (DRAPES) ×4 IMPLANT
DECANTER SPIKE VIAL GLASS SM (MISCELLANEOUS) IMPLANT
DERMABOND ADVANCED (GAUZE/BANDAGES/DRESSINGS) ×1
DERMABOND ADVANCED .7 DNX12 (GAUZE/BANDAGES/DRESSINGS) ×3 IMPLANT
DRSG OPSITE POSTOP 3X4 (GAUZE/BANDAGES/DRESSINGS) IMPLANT
DURAPREP 26ML APPLICATOR (WOUND CARE) ×4 IMPLANT
GAUZE 4X4 16PLY ~~LOC~~+RFID DBL (SPONGE) ×4 IMPLANT
GLOVE SURG ENC MOIS LTX SZ6.5 (GLOVE) ×8 IMPLANT
GOWN STRL REUS W/TWL LRG LVL3 (GOWN DISPOSABLE) ×12 IMPLANT
KIT TURNOVER CYSTO (KITS) ×4 IMPLANT
MANIPULATOR UTERINE 7CM CLEARV (MISCELLANEOUS) ×4 IMPLANT
NS IRRIG 1000ML POUR BTL (IV SOLUTION) IMPLANT
NS IRRIG 500ML POUR BTL (IV SOLUTION) ×4 IMPLANT
PACK LAPAROSCOPY BASIN (CUSTOM PROCEDURE TRAY) ×4 IMPLANT
PACK TRENDGUARD 450 HYBRID PRO (MISCELLANEOUS) ×3 IMPLANT
POUCH SPECIMEN RETRIEVAL 10MM (ENDOMECHANICALS) IMPLANT
PROTECTOR NERVE ULNAR (MISCELLANEOUS) IMPLANT
SET SUCTION IRRIG HYDROSURG (IRRIGATION / IRRIGATOR) IMPLANT
SET TUBE SMOKE EVAC HIGH FLOW (TUBING) ×4 IMPLANT
SHEARS HARMONIC ACE PLUS 36CM (ENDOMECHANICALS) ×4 IMPLANT
SLEEVE XCEL OPT CAN 5 100 (ENDOMECHANICALS) ×4 IMPLANT
SOL PREP POV-IOD 4OZ 10% (MISCELLANEOUS) ×4 IMPLANT
SOLUTION ELECTROLUBE (MISCELLANEOUS) ×4 IMPLANT
SUT VIC AB 3-0 PS2 18 (SUTURE) ×4
SUT VIC AB 3-0 PS2 18XBRD (SUTURE) ×3 IMPLANT
SUT VICRYL 0 UR6 27IN ABS (SUTURE) IMPLANT
SYR 50ML LL SCALE MARK (SYRINGE) ×4 IMPLANT
SYSTEM CARTER THOMASON II (TROCAR) IMPLANT
TOWEL OR 17X26 10 PK STRL BLUE (TOWEL DISPOSABLE) ×4 IMPLANT
TRENDGUARD 450 HYBRID PRO PACK (MISCELLANEOUS) ×4
TROCAR BLADELESS OPT 5 100 (ENDOMECHANICALS) ×8 IMPLANT
TROCAR XCEL NON-BLD 11X100MML (ENDOMECHANICALS) IMPLANT
WARMER LAPAROSCOPE (MISCELLANEOUS) ×4 IMPLANT

## 2020-08-26 NOTE — Interval H&P Note (Signed)
History and Physical Interval Note: Pt seen. She reports shehad spotting and cramping last night but resolved today. Urine shows likely uti. No other new issues. Will treat with antibx in OR Reviewed planned procedure and expectations Risks/benefits discussed Consent confirmed To OR when ready  08/26/2020 8:29 AM  Illene Labrador  has presented today for surgery, with the diagnosis of cyst of ovary, pelvic pain.  The various methods of treatment have been discussed with the patient and family. After consideration of risks, benefits and other options for treatment, the patient has consented to  Procedure(s): LAPAROSCOPY DIAGNOSTIC (N/A) LAPAROSCOPIC OVARIAN CYSTECTOMY (Bilateral) LAPAROSCOPIC OOPHORECTOMY (N/A) ABLATION ON ENDOMETRIOSIS (N/A) LAPAROSCOPIC LYSIS OF ADHESIONS (N/A) as a surgical intervention.  The patient's history has been reviewed, patient examined, no change in status, stable for surgery.  I have reviewed the patient's chart and labs.  Questions were answered to the patient's satisfaction.     Isaiah Serge

## 2020-08-26 NOTE — Anesthesia Preprocedure Evaluation (Addendum)
Anesthesia Evaluation  Patient identified by MRN, date of birth, ID band Patient awake    Reviewed: Allergy & Precautions, NPO status , Patient's Chart, lab work & pertinent test results  Airway Mallampati: II  TM Distance: >3 FB Neck ROM: Full    Dental  (+) Teeth Intact, Dental Advisory Given   Pulmonary sleep apnea ,    breath sounds clear to auscultation       Cardiovascular negative cardio ROS   Rhythm:Regular Rate:Normal     Neuro/Psych PSYCHIATRIC DISORDERS negative neurological ROS     GI/Hepatic Neg liver ROS, GERD  Medicated,  Endo/Other  Hypothyroidism   Renal/GU negative Renal ROS     Musculoskeletal negative musculoskeletal ROS (+)   Abdominal (+) + obese,   Peds  Hematology negative hematology ROS (+)   Anesthesia Other Findings   Reproductive/Obstetrics                            Anesthesia Physical Anesthesia Plan  ASA: 3  Anesthesia Plan: General   Post-op Pain Management:    Induction: Intravenous  PONV Risk Score and Plan: 4 or greater and Ondansetron, Dexamethasone, Midazolam and Scopolamine patch - Pre-op  Airway Management Planned: Oral ETT  Additional Equipment: None  Intra-op Plan:   Post-operative Plan: Extubation in OR  Informed Consent: I have reviewed the patients History and Physical, chart, labs and discussed the procedure including the risks, benefits and alternatives for the proposed anesthesia with the patient or authorized representative who has indicated his/her understanding and acceptance.     Dental advisory given  Plan Discussed with: CRNA  Anesthesia Plan Comments:        Anesthesia Quick Evaluation

## 2020-08-26 NOTE — Op Note (Signed)
Operative Note    Preoperative Diagnosis Pelvic Pain  Bilateral ovarian cysts   Postoperative Diagnosis Same 3. Endometriosis 4. Intraabdominal adhesions   Procedure: Laparoscopic bilateral ovarian cystectomy with lysis of adhesions and ablation of endometriosis    Surgeon: Mickle Mallory DO   Anesthesia: General  Fluids: LR 1L EBL: <4m UOP: 1023m  Findings: Hemorrhagic cyst on right ovary and simple appearing cyst on left ovary with a paratubal cyst on left.  Endometrial implant on posterior adhesed cul de sac. Adhesions in posterior cul de sac and enveloping right ovary and tube    Specimen: None   Procedure Note Consent reviewed and confirmed in periop area. Patient was taken to the operating room where general anesthesia was administered without difficulty. She was then prepped and draped in the normal sterile fashion while in the dorsal lithotomy position. An appropriate timeout was performed.   A speculum was then placed within the vagina and a ClearVue manipulator placed. The bladder was emptied with a red rubber. Her legs were lowered and attention was then turned to the patient's abdomen after draping.   A 5 mm infraumbilical incision was made after injecting with 0.25% plain lidocaine, and a 6m47mptiview trocar and camera easily introduced into the abdominal cavity.Gas flow was then applied and a pneumoperitoneum obtained with approximate 3 L of CO2 gas. A 6mm80mocar was placed in the patients right lower uterine segment under visualization and a better survey done.    The patient was placed in trendelenberg position. With the uterus anteverted the right ovary and tube were noted to be adhesed posterior to the uterus.   A third 6mm 72mcar was placed in the left  lower quadrant under direct visualization. The harmonic scalpel was then used to excise the adhesions around the right ovary thereby freeing the both ovary and tube. A small cyst was noted on the ovary. It was  excised with old blood noted. Hemostasis achieved.  Next an endometrial implant on the posterior cul de sac was ablated successfully. There appeared to be a contained endometrioma in a thick fabric of adhesions under the right uterosacral ligament that was not easy to access. Several filmy and thick bands of adhesions were however successfully reduced thereby exposing the previously obliterated posterior cul de sac with less restrictions achieved.    At this time the left ovary and tube were inspected. A simple appearing cyst on the ovary was excised. A paratubal cyst with clear fluid was noted on the fallopian tube.   Irrigation of the pelvis was performed . Hemostasis confirmed.   The remainder of the pelvis and abdomen was inspected and found to be grossly normal.   The patient was thereby flattened and trocars removed under visualization. Pneumoperitoneum was allowed to escape prior to the umbilical trocar being removed with anesthesia effecting deep breaths.  Incisions were closed with 4-0 vicryl with dermabond applied afterwards.  The ClearVue manipulator was removed with no complications.   Patient was then awakened and taken to the recovery room in stable  condition. Counts were all confirmed per staff x 2

## 2020-08-26 NOTE — Anesthesia Postprocedure Evaluation (Signed)
Anesthesia Post Note  Patient: Kelli Ferrell  Procedure(s) Performed: LAPAROSCOPY DIAGNOSTIC (Abdomen) LAPAROSCOPIC OVARIAN CYSTECTOMY (Bilateral: Abdomen) ABLATION ON ENDOMETRIOSIS (Abdomen) LAPAROSCOPIC LYSIS OF ADHESIONS (Abdomen)     Patient location during evaluation: PACU Anesthesia Type: General Level of consciousness: awake and alert Pain management: pain level controlled Vital Signs Assessment: post-procedure vital signs reviewed and stable Respiratory status: spontaneous breathing, nonlabored ventilation, respiratory function stable and patient connected to nasal cannula oxygen Cardiovascular status: blood pressure returned to baseline and stable Postop Assessment: no apparent nausea or vomiting Anesthetic complications: no   No notable events documented.  Last Vitals:  Vitals:   08/26/20 1030 08/26/20 1110  BP: 126/71 134/80  Pulse: (!) 58 (!) 56  Resp: 12 17  Temp:  36.4 C  SpO2: 100% 97%    Last Pain:  Vitals:   08/26/20 1053  TempSrc:   PainSc: [place]

## 2020-08-26 NOTE — Anesthesia Procedure Notes (Addendum)
Procedure Name: Intubation Date/Time: 08/26/2020 8:50 AM Performed by: Mechele Claude, CRNA Pre-anesthesia Checklist: Patient identified, Emergency Drugs available, Suction available and Patient being monitored Patient Re-evaluated:Patient Re-evaluated prior to induction Oxygen Delivery Method: Circle system utilized Preoxygenation: Pre-oxygenation with 100% oxygen Induction Type: IV induction Ventilation: Mask ventilation without difficulty Laryngoscope Size: Mac and 3 Grade View: Grade I Tube type: Oral Number of attempts: 1 Airway Equipment and Method: Stylet Placement Confirmation: ETT inserted through vocal cords under direct vision, positive ETCO2 and breath sounds checked- equal and bilateral Secured at: 22 cm Tube secured with: Tape Dental Injury: Teeth and Oropharynx as per pre-operative assessment

## 2020-08-26 NOTE — Discharge Instructions (Addendum)
Call office with any concerns [phone]    Post Anesthesia Home Care Instructions  Activity: Get plenty of rest for the remainder of the day. A responsible individual must stay with you for 24 hours following the procedure.  For the next 24 hours, DO NOT: -Drive a car -Paediatric nurse -Drink alcoholic beverages -Take any medication unless instructed by your physician -Make any legal decisions or sign important papers.  Meals: Start with liquid foods such as gelatin or soup. Progress to regular foods as tolerated. Avoid greasy, spicy, heavy foods. If nausea and/or vomiting occur, drink only clear liquids until the nausea and/or vomiting subsides. Call your physician if vomiting continues.  Special Instructions/Symptoms: Your throat may feel dry or sore from the anesthesia or the breathing tube placed in your throat during surgery. If this causes discomfort, gargle with warm salt water. The discomfort should disappear within 24 hours.  If you had a scopolamine patch placed behind your ear for the management of post- operative nausea and/or vomiting:  1. The medication in the patch is effective for 72 hours, after which it should be removed.  Wrap patch in a tissue and discard in the trash. Wash hands thoroughly with soap and water. 2. You may remove the patch earlier than 72 hours if you experience unpleasant side effects which may include dry mouth, dizziness or visual disturbances. 3. Avoid touching the patch. Wash your hands with soap and water after contact with the patch.

## 2020-08-26 NOTE — Transfer of Care (Signed)
Immediate Anesthesia Transfer of Care Note  Patient: Kelli Ferrell  Procedure(s) Performed: Procedure(s) (LRB): LAPAROSCOPY DIAGNOSTIC (N/A) LAPAROSCOPIC OVARIAN CYSTECTOMY (Bilateral) ABLATION ON ENDOMETRIOSIS (N/A) LAPAROSCOPIC LYSIS OF ADHESIONS (N/A)  Patient Location: PACU  Anesthesia Type: General  Level of Consciousness: awake, alert  and oriented  Airway & Oxygen Therapy: Patient Spontanous Breathing and Patient connected to nasal cannula oxygen  Post-op Assessment: Report given to PACU RN and Post -op Vital signs reviewed and stable  Post vital signs: Reviewed and stable  Complications: No apparent anesthesia complications  Last Vitals:  Vitals Value Taken Time  BP 133/72 08/26/20 0953  Temp 36.5 C 08/26/20 0952  Pulse 71 08/26/20 1000  Resp 17 08/26/20 1000  SpO2 97 % 08/26/20 1000  Vitals shown include unvalidated device data.  Last Pain:  Vitals:   08/26/20 0952  TempSrc:   PainSc: Asleep      Patients Stated Pain Goal: 8 ([id number])  Complications: No notable events documented.

## 2020-08-26 NOTE — Discharge Summary (Signed)
Physician Discharge Summary  Patient ID: Kelli Ferrell MRN: [id number] DOB/AGE: 1970-02-07 50 y.o.  Admit date: 08/26/2020 Discharge date: 08/26/2020  Admission Diagnoses:  Discharge Diagnoses:  Active Problems:   * No active hospital problems. *   Discharged Condition: stable  Hospital Course: Pt underwent laparoscopic bilateral ovarian cystectomy with lysis of adhesions and ablation of endometriosis with no complications.   Consults: None  Significant Diagnostic Studies: labs: wnl  Treatments: analgesia: ibuprofen and percocet and surgery: see hpi  Discharge Exam: Blood pressure 133/69, pulse 71, temperature 97.7 F (36.5 C), resp. rate 17, height '5\' 1"'$  (1.549 m), weight 97.2 kg, last menstrual period 07/17/2020, SpO2 97 %. General appearance: alert, cooperative, and no distress Pelvic: cervix normal in appearance, external genitalia normal, uterus normal size, shape, and consistency, and vagina normal without discharge  Disposition: Discharge disposition: 01-Home or Self Care       Discharge Instructions     Activity as tolerated - No restrictions   Complete by: As directed    Call MD for:  persistant nausea and vomiting   Complete by: As directed    Call MD for:  redness, tenderness, or signs of infection (pain, swelling, redness, odor or green/yellow discharge around incision site)   Complete by: As directed    Call MD for:  severe uncontrolled pain   Complete by: As directed    Call MD for:  temperature >100.4   Complete by: As directed    Diet - low sodium heart healthy   Complete by: As directed    Discharge instructions   Complete by: As directed    Call office with any concerns [phone]   Discharge wound care:   Complete by: As directed    Keep clean and dry; may shower   Driving Restrictions   Complete by: As directed    None while taking narcotic medication   Increase activity slowly   Complete by: As directed    Lifting restrictions    Complete by: As directed    As tolerated   Sexual Activity Restrictions   Complete by: As directed    None for 6 weeks      Allergies as of 08/26/2020   No Known Allergies      Medication List     TAKE these medications    BIOTIN PO Take 1 tablet by mouth daily.   CALCIUM-MAGNESIUM-ZINC PO Take 1 tablet by mouth daily.   cetirizine 10 MG tablet Commonly known as: ZYRTEC Take 10 mg by mouth daily.   ESTROVEN PO Take 1 tablet by mouth daily.   GINGER ROOT PO Take 1 tablet by mouth daily.   ibuprofen 600 MG tablet Commonly known as: ADVIL Take 1 tablet (600 mg total) by mouth every 6 (six) hours as needed for moderate pain or cramping.   Melatonin 10 MG Tabs Take 1 tablet by mouth at bedtime as needed.   MILK THISTLE PO Take 1 tablet by mouth daily.   multivitamin tablet Take 1 tablet by mouth daily.   oxyCODONE-acetaminophen 5-325 MG tablet Commonly known as: Percocet Take 1 tablet by mouth every 4 (four) hours as needed for up to 5 days for severe pain.   POTASSIUM PO Take 1 tablet by mouth daily. OTC   PRENATAL VITAMIN PO Take 1 tablet by mouth daily.   PROBIOTIC PO Take 1 tablet by mouth daily.   Vitamin D 50 MCG (2000 UT) tablet Take 2,000 Units by mouth daily.  ASK your doctor about these medications    levothyroxine 100 MCG tablet Commonly known as: SYNTHROID TAKE 1 TABLET DAILY BEFORE BREAKFAST (NEED AN APPOINTMENT)   pantoprazole 40 MG tablet Commonly known as: PROTONIX Take 1 tablet (40 mg total) by mouth daily.               Discharge Care Instructions  (From admission, onward)           Start     Ordered   08/26/20 0000  Discharge wound care:       Comments: Keep clean and dry; may shower   08/26/20 Guttenberg, Blanco, DO. Go on 09/14/2020.   Specialty: Obstetrics and Gynecology Why: For post op visit Contact information: [address] [place]  [place] [address] [phone]                 Signed: Isaiah Serge 08/26/2020, 10:16 AM

## 2020-08-29 ENCOUNTER — Encounter (HOSPITAL_BASED_OUTPATIENT_CLINIC_OR_DEPARTMENT_OTHER): Payer: Self-pay | Admitting: Obstetrics and Gynecology

## 2020-10-19 ENCOUNTER — Other Ambulatory Visit: Payer: Self-pay | Admitting: Internal Medicine

## 2020-10-19 DIAGNOSIS — E039 Hypothyroidism, unspecified: Secondary | ICD-10-CM

## 2020-11-28 DIAGNOSIS — M25561 Pain in right knee: Secondary | ICD-10-CM | POA: Insufficient documentation

## 2021-04-17 ENCOUNTER — Other Ambulatory Visit: Payer: Self-pay | Admitting: Internal Medicine

## 2021-04-17 DIAGNOSIS — E039 Hypothyroidism, unspecified: Secondary | ICD-10-CM

## 2021-04-28 ENCOUNTER — Encounter: Payer: 59 | Admitting: Internal Medicine

## 2021-05-10 ENCOUNTER — Encounter: Payer: Self-pay | Admitting: Internal Medicine

## 2021-05-10 ENCOUNTER — Ambulatory Visit (INDEPENDENT_AMBULATORY_CARE_PROVIDER_SITE_OTHER): Payer: 59 | Admitting: Internal Medicine

## 2021-05-10 VITALS — BP 110/80 | HR 68 | Temp 97.8°F | Ht 61.0 in | Wt 207.0 lb

## 2021-05-10 DIAGNOSIS — E039 Hypothyroidism, unspecified: Secondary | ICD-10-CM

## 2021-05-10 DIAGNOSIS — E785 Hyperlipidemia, unspecified: Secondary | ICD-10-CM

## 2021-05-10 DIAGNOSIS — Z0000 Encounter for general adult medical examination without abnormal findings: Secondary | ICD-10-CM

## 2021-05-10 LAB — CBC WITH DIFFERENTIAL/PLATELET
Basophils Absolute: 0 10*3/uL (ref 0.0–0.1)
Basophils Relative: 0.3 % (ref 0.0–3.0)
Eosinophils Absolute: 1.3 10*3/uL — ABNORMAL HIGH (ref 0.0–0.7)
Eosinophils Relative: 20.4 % — ABNORMAL HIGH (ref 0.0–5.0)
HCT: 42 % (ref 36.0–46.0)
Hemoglobin: 14 g/dL (ref 12.0–15.0)
Lymphocytes Relative: 22.3 % (ref 12.0–46.0)
Lymphs Abs: 1.4 10*3/uL (ref 0.7–4.0)
MCHC: 33.4 g/dL (ref 30.0–36.0)
MCV: 89.5 fl (ref 78.0–100.0)
Monocytes Absolute: 0.4 10*3/uL (ref 0.1–1.0)
Monocytes Relative: 6.6 % (ref 3.0–12.0)
Neutro Abs: 3.2 10*3/uL (ref 1.4–7.7)
Neutrophils Relative %: 50.4 % (ref 43.0–77.0)
Platelets: 298 10*3/uL (ref 150.0–400.0)
RBC: 4.69 Mil/uL (ref 3.87–5.11)
RDW: 13.5 % (ref 11.5–15.5)
WBC: 6.3 10*3/uL (ref 4.0–10.5)

## 2021-05-10 LAB — VITAMIN D 25 HYDROXY (VIT D DEFICIENCY, FRACTURES): VITD: 36.1 ng/mL (ref 30.00–100.00)

## 2021-05-10 LAB — LIPID PANEL
Cholesterol: 167 mg/dL (ref 0–200)
HDL: 31.7 mg/dL — ABNORMAL LOW (ref >39.00)
LDL Cholesterol: 111 mg/dL — ABNORMAL HIGH (ref 0–99)
NonHDL: 135.59
Total CHOL/HDL Ratio: 5
Triglycerides: 125 mg/dL (ref 0.0–149.0)
VLDL: 25 mg/dL (ref 0.0–40.0)

## 2021-05-10 LAB — HEMOGLOBIN A1C: Hgb A1c MFr Bld: 5.2 % (ref 4.6–6.5)

## 2021-05-10 LAB — COMPREHENSIVE METABOLIC PANEL
ALT: 17 U/L (ref 0–35)
AST: 14 U/L (ref 0–37)
Albumin: 3.9 g/dL (ref 3.5–5.2)
Alkaline Phosphatase: 54 U/L (ref 39–117)
BUN: 9 mg/dL (ref 6–23)
CO2: 27 mEq/L (ref 19–32)
Calcium: 9 mg/dL (ref 8.4–10.5)
Chloride: 105 mEq/L (ref 96–112)
Creatinine, Ser: 0.9 mg/dL (ref 0.40–1.20)
GFR: 74.79 mL/min (ref >60.00)
Glucose, Bld: 96 mg/dL (ref 70–99)
Potassium: 3.9 mEq/L (ref 3.5–5.1)
Sodium: 139 mEq/L (ref 135–145)
Total Bilirubin: 0.4 mg/dL (ref 0.2–1.2)
Total Protein: 6.6 g/dL (ref 6.0–8.3)

## 2021-05-10 LAB — VITAMIN B12: Vitamin B-12: 565 pg/mL (ref 211–911)

## 2021-05-10 LAB — TSH: TSH: 4.4 u[IU]/mL (ref 0.35–5.50)

## 2021-05-10 NOTE — Progress Notes (Signed)
? ? ? ?Established Patient Office Visit ? ? ? ? ?This visit occurred during the SARS-CoV-2 public health emergency.  Safety protocols were in place, including screening questions prior to the visit, additional usage of staff PPE, and extensive cleaning of exam room while observing appropriate contact time as indicated for disinfecting solutions.  ? ? ?CC/Reason for Visit: Annual preventive exam ? ?HPI: Kelli Ferrell is a 51 y.o. female who is coming in today for the above mentioned reasons. Past Medical History is significant for: Hypothyroidism, obesity, GERD.  She is doing well and has no acute concerns.  Since I last saw her she had surgery for her endometriosis with lysis of adhesions.  She has routine eye and dental care.  She is overdue for shingles series and COVID booster.  She did not get her flu vaccine this year.  She states she had a mammogram in April 2022 at her [place] office, is overdue for Pap, she had a colonoscopy in 2021. ? ? ?Past Medical/Surgical History: ?Past Medical History:  ?Diagnosis Date  ? Dyslipidemia 11/18/2014  ? Fibroid, uterine   ? GERD (gastroesophageal reflux disease)   ? History of positive PPD 2017  ? -neg skin test and neg xray -seen by health department and they offered but did not feel treatment was needed - she opted against treatment for POSSIBLE latent TB -in 2017  ? Hypothyroidism   ? followed by pcp  ? Mild obstructive sleep apnea   ? study in epic 06-29-2020 , per pt has not decided type of treatment recommended yet  ? Ovarian cyst   ? Pelvic pain in female   ? Seasonal allergies 11/22/2015  ? ? ?Past Surgical History:  ?Procedure Laterality Date  ? ABLATION ON ENDOMETRIOSIS N/A 08/26/2020  ? Procedure: ABLATION ON ENDOMETRIOSIS;  Surgeon: Sherlyn Hay, DO;  Location: [facility];  Service: Gynecology;  Laterality: N/A;  ? COLONOSCOPY  04/2019  ? [place] OF UTERUS  2021  ? LAPAROSCOPIC LYSIS OF ADHESIONS N/A 08/26/2020  ?  Procedure: LAPAROSCOPIC LYSIS OF ADHESIONS;  Surgeon: Sherlyn Hay, DO;  Location: [facility];  Service: Gynecology;  Laterality: N/A;  ? LAPAROSCOPIC OVARIAN CYSTECTOMY Bilateral 08/26/2020  ? Procedure: LAPAROSCOPIC OVARIAN CYSTECTOMY;  Surgeon: Sherlyn Hay, DO;  Location: [facility];  Service: Gynecology;  Laterality: Bilateral;  ? LAPAROSCOPY N/A 08/26/2020  ? Procedure: LAPAROSCOPY DIAGNOSTIC;  Surgeon: Sherlyn Hay, DO;  Location: [facility];  Service: Gynecology;  Laterality: N/A;  ? NASAL SINUS SURGERY  2007  ? TUBAL LIGATION Bilateral 2001  ? UTERINE FIBROID EMBOLIZATION  2014  ? ? ?Social History: ? reports that she has never smoked. She has never used smokeless tobacco. She reports current alcohol use. She reports that she does not use drugs. ? ?Allergies: ?Allergies  ?Allergen Reactions  ? Bactrim [Sulfamethoxazole-Trimethoprim]   ? ? ?Family History:  ?Family History  ?Problem Relation Age of Onset  ? Diabetes Mother   ? Stroke Mother   ? Hypertension Mother   ? Diabetes Father   ? Diabetes Brother   ? Colon cancer Neg Hx   ? Colon polyps Neg Hx   ? Esophageal cancer Neg Hx   ? Rectal cancer Neg Hx   ? Stomach cancer Neg Hx   ? ? ? ?Current Outpatient Medications:  ?  BIOTIN PO, Take 1 tablet by mouth daily., Disp: , Rfl:  ?  CALCIUM-MAGNESIUM-ZINC PO, Take 1 tablet by  mouth daily., Disp: , Rfl:  ?  cetirizine (ZYRTEC) 10 MG tablet, Take 10 mg by mouth daily., Disp: , Rfl:  ?  Cholecalciferol (VITAMIN D) 50 MCG (2000 UT) tablet, Take 2,000 Units by mouth daily., Disp: , Rfl:  ?  Ginger, Zingiber officinalis, (GINGER ROOT PO), Take 1 tablet by mouth daily., Disp: , Rfl:  ?  levothyroxine (SYNTHROID) 100 MCG tablet, TAKE 1 TABLET DAILY BEFORE BREAKFAST, Disp: 90 tablet, Rfl: 0 ?  Melatonin 10 MG TABS, Take 1 tablet by mouth at bedtime as needed., Disp: , Rfl:  ?  MILK THISTLE PO, Take 1 tablet by mouth daily., Disp: , Rfl:  ?   Multiple Vitamin (MULTIVITAMIN) tablet, Take 1 tablet by mouth daily., Disp: , Rfl:  ?  Nutritional Supplements (ESTROVEN PO), Take 1 tablet by mouth daily., Disp: , Rfl:  ?  pantoprazole (PROTONIX) 40 MG tablet, Take 1 tablet (40 mg total) by mouth daily. (Patient taking differently: Take 40 mg by mouth daily as needed.), Disp: 90 tablet, Rfl: 1 ?  POTASSIUM PO, Take 1 tablet by mouth daily. OTC, Disp: , Rfl:  ?  Prenatal Vit-Fe Fumarate-FA (PRENATAL VITAMIN PO), Take 1 tablet by mouth daily., Disp: , Rfl:  ?  Probiotic Product (PROBIOTIC PO), Take 1 tablet by mouth daily., Disp: , Rfl:  ? ?Review of Systems:  ?Constitutional: Denies fever, chills, diaphoresis, appetite change and fatigue.  ?HEENT: Denies photophobia, eye pain, redness, hearing loss, ear pain, congestion, sore throat, rhinorrhea, sneezing, mouth sores, trouble swallowing, neck pain, neck stiffness and tinnitus.   ?Respiratory: Denies SOB, DOE, cough, chest tightness,  and wheezing.   ?Cardiovascular: Denies chest pain, palpitations and leg swelling.  ?Gastrointestinal: Denies nausea, vomiting, abdominal pain, diarrhea, constipation, blood in stool and abdominal distention.  ?Genitourinary: Denies dysuria, urgency, frequency, hematuria, flank pain and difficulty urinating.  ?Endocrine: Denies: hot or cold intolerance, sweats, changes in hair or nails, polyuria, polydipsia. ?Musculoskeletal: Denies myalgias, back pain, joint swelling, arthralgias and gait problem.  ?Skin: Denies pallor, rash and wound.  ?Neurological: Denies dizziness, seizures, syncope, weakness, light-headedness, numbness and headaches.  ?Hematological: Denies adenopathy. Easy bruising, personal or family bleeding history  ?Psychiatric/Behavioral: Denies suicidal ideation, mood changes, confusion, nervousness, sleep disturbance and agitation ? ? ?[facility] Office Visit from 04/27/2020 in [facility]  ?PHQ-9 Total Score 3  ? ?  ? ? ? ? ?Physical  Exam: ?Vitals:  ? 05/10/21 0716  ?BP: 110/80  ?Pulse: 68  ?Temp: 97.8 ?F (36.6 ?C)  ?TempSrc: Oral  ?SpO2: 98%  ?Weight: 207 lb (93.9 kg)  ?Height: '5\' 1"'$  (1.549 m)  ? ? ?Body mass index is 39.11 kg/m?. ? ? ?Constitutional: NAD, calm, comfortable ?Eyes: PERRL, lids and conjunctivae normal ?ENMT: Mucous membranes are moist. Posterior pharynx clear of any exudate or lesions. Normal dentition. Tympanic membrane is pearly white, no erythema or bulging. ?Neck: normal, supple, no masses, no thyromegaly ?Respiratory: clear to auscultation bilaterally, no wheezing, no crackles. Normal respiratory effort. No accessory muscle use.  ?Cardiovascular: Regular rate and rhythm, no murmurs / rubs / gallops. No extremity edema. 2+ pedal pulses. No carotid bruits.  ?Abdomen: no tenderness, no masses palpated. No hepatosplenomegaly. Bowel sounds positive.  ?Musculoskeletal: no clubbing / cyanosis. No joint deformity upper and lower extremities. Good ROM, no contractures. Normal muscle tone.  ?Skin: no rashes, lesions, ulcers. No induration ?Neurologic: CN 2-12 grossly intact. Sensation intact, DTR normal. Strength 5/5 in all 4.  ?Psychiatric: Normal judgment and insight. Alert and oriented  x 3. Normal mood.  ? ? ?Impression and Plan: ? ?Encounter for preventive health examination ?-Recommend routine eye and dental care. ?-Immunizations: Advised COVID booster and shingles vaccine, otherwise immunizations are up-to-date ?-Healthy lifestyle discussed in detail. ?-Labs to be updated today. ?-Colon cancer screening: 05/2019 ?-Breast cancer screening: 04/2020 ?-Cervical cancer screening: Overdue, will schedule with GYN ?-Lung cancer screening: Not applicable ?-Prostate cancer screening: Not applicable ?-DEXA: Not applicable ? ?Dyslipidemia ? - Plan: Lipid panel ? ?Morbid obesity ([place]) ? - Plan: Hemoglobin A1c, Vitamin B12, VITAMIN D 25 Hydroxy (Vit-D Deficiency, Fractures) ?-Discussed healthy lifestyle, including increased physical activity  and better food choices to promote weight loss. ? ?Hypothyroidism, unspecified type  ?- Plan: CBC with Differential/Platelet, Comprehensive metabolic panel, TSH ? ? ? ? ?Patient Instructions  ?-Nice seeing you today!! ? ?-Lab w

## 2021-05-10 NOTE — Patient Instructions (Signed)
-  Nice seeing you today!! ? ?-Lab work today; will notify you once results are available. ? ?-Remember COVID booster and shingles vaccine. ? ?-Schedule follow up in 1 year or sooner as needed. ? ? ?

## 2021-06-20 LAB — HM MAMMOGRAPHY

## 2021-06-23 DIAGNOSIS — Z0289 Encounter for other administrative examinations: Secondary | ICD-10-CM

## 2021-07-13 ENCOUNTER — Encounter: Payer: Self-pay | Admitting: Internal Medicine

## 2021-07-13 DIAGNOSIS — E039 Hypothyroidism, unspecified: Secondary | ICD-10-CM

## 2021-07-17 ENCOUNTER — Other Ambulatory Visit: Payer: Self-pay | Admitting: Internal Medicine

## 2021-07-17 DIAGNOSIS — E039 Hypothyroidism, unspecified: Secondary | ICD-10-CM

## 2021-08-03 ENCOUNTER — Encounter (INDEPENDENT_AMBULATORY_CARE_PROVIDER_SITE_OTHER): Payer: Self-pay | Admitting: Family Medicine

## 2021-08-03 ENCOUNTER — Ambulatory Visit (INDEPENDENT_AMBULATORY_CARE_PROVIDER_SITE_OTHER): Payer: 59 | Admitting: Family Medicine

## 2021-08-03 VITALS — BP 95/64 | HR 58 | Temp 97.9°F | Ht 61.0 in | Wt 182.0 lb

## 2021-08-03 DIAGNOSIS — R5383 Other fatigue: Secondary | ICD-10-CM | POA: Diagnosis not present

## 2021-08-03 DIAGNOSIS — R898 Other abnormal findings in specimens from other organs, systems and tissues: Secondary | ICD-10-CM

## 2021-08-03 DIAGNOSIS — D721 Eosinophilia, unspecified: Secondary | ICD-10-CM | POA: Diagnosis not present

## 2021-08-03 DIAGNOSIS — R0602 Shortness of breath: Secondary | ICD-10-CM

## 2021-08-03 DIAGNOSIS — Z1331 Encounter for screening for depression: Secondary | ICD-10-CM

## 2021-08-03 DIAGNOSIS — E039 Hypothyroidism, unspecified: Secondary | ICD-10-CM

## 2021-08-03 DIAGNOSIS — Z87442 Personal history of urinary calculi: Secondary | ICD-10-CM

## 2021-08-03 DIAGNOSIS — E559 Vitamin D deficiency, unspecified: Secondary | ICD-10-CM

## 2021-08-03 DIAGNOSIS — E669 Obesity, unspecified: Secondary | ICD-10-CM

## 2021-08-03 DIAGNOSIS — E782 Mixed hyperlipidemia: Secondary | ICD-10-CM | POA: Diagnosis not present

## 2021-08-03 DIAGNOSIS — Z6834 Body mass index (BMI) 34.0-34.9, adult: Secondary | ICD-10-CM

## 2021-08-04 LAB — CBC WITH DIFFERENTIAL/PLATELET
Basophils Absolute: 0 10*3/uL (ref 0.0–0.2)
Basos: 0 %
EOS (ABSOLUTE): 0.2 10*3/uL (ref 0.0–0.4)
Eos: 3 %
Hematocrit: 42.1 % (ref 34.0–46.6)
Hemoglobin: 13.4 g/dL (ref 11.1–15.9)
Immature Grans (Abs): 0 10*3/uL (ref 0.0–0.1)
Immature Granulocytes: 0 %
Lymphocytes Absolute: 1.2 10*3/uL (ref 0.7–3.1)
Lymphs: 20 %
MCH: 29.6 pg (ref 26.6–33.0)
MCHC: 31.8 g/dL (ref 31.5–35.7)
MCV: 93 fL (ref 79–97)
Monocytes Absolute: 0.4 10*3/uL (ref 0.1–0.9)
Monocytes: 8 %
Neutrophils Absolute: 4 10*3/uL (ref 1.4–7.0)
Neutrophils: 69 %
Platelets: 318 10*3/uL (ref 150–450)
RBC: 4.53 x10E6/uL (ref 3.77–5.28)
RDW: 12.8 % (ref 11.7–15.4)
WBC: 5.9 10*3/uL (ref 3.4–10.8)

## 2021-08-04 LAB — FOLATE: Folate: >20 ng/mL (ref >3.0)

## 2021-08-04 LAB — T3: T3, Total: 78 ng/dL (ref 71–180)

## 2021-08-04 LAB — T4, FREE: Free T4: 1.42 ng/dL (ref 0.82–1.77)

## 2021-08-04 LAB — INSULIN, RANDOM: INSULIN: 5 u[IU]/mL (ref 2.6–24.9)

## 2021-08-08 NOTE — Progress Notes (Signed)
Chief Complaint:   OBESITY Kelli Ferrell (MR# [id number]) is a 51 y.o. female who presents for evaluation and treatment of obesity and related comorbidities. Current BMI is Body mass index is 34.39 kg/m. Kelli Ferrell has been struggling with her weight for many years and has been unsuccessful in either losing weight, maintaining weight loss, or reaching her healthy weight goal.  Kelli Ferrell's daughter comes to our clinic.  Patient works as a [employer].  She travels frequently.  Started cutting carbs out in April around her 51th birthday (has lost 40 pounds).  Cut most simple carbohydrates and she is doing intermittent fasting.  Skips breakfast daily.  Water in the morning.  Most water in the a.m.  Lunch, lean protein 4 to 6 ounces, 1 cup of vegetables.  Dinner, steak 6 ounces precooked, and vegetables.  Kelli Ferrell is currently in the action stage of change and ready to dedicate time achieving and maintaining a healthier weight. Kelli Ferrell is interested in becoming our patient and working on intensive lifestyle modifications including (but not limited to) diet and exercise for weight loss.  Kelli Ferrell's habits were reviewed today and are as follows: Her family eats meals together, she thinks her family will eat healthier with her, her desired weight loss is 32 lbs, she has been heavy most of her life, she started gaining weight in 1994 after her first pregnancy, her heaviest weight ever was 225 pounds, she has significant food cravings issues, she skips meals frequently, she is frequently drinking liquids with calories, and she struggles with emotional eating.  Depression Screen Kelli Ferrell's Food and Mood (modified PHQ-9) score was 16.     08/03/2021    7:50 AM  Depression screen PHQ 2/9  Decreased Interest 3  Down, Depressed, Hopeless 1  PHQ - 2 Score 4  Altered sleeping 3  Tired, decreased energy 3  Change in appetite 3  Feeling bad or failure  about yourself  1  Trouble concentrating 2  Moving slowly or fidgety/restless 0  Suicidal thoughts 0  PHQ-9 Score 16  Difficult doing work/chores Somewhat difficult   Subjective:   1. Other fatigue Kelli Ferrell admits to daytime somnolence and admits to waking up still tired. Patient has a history of symptoms of daytime fatigue, morning fatigue, and morning headache. Kelli Ferrell generally gets 5 or 6 hours of sleep per night, and states that she has nightime awakenings. Snoring is present. Apneic episodes are not present. [name] is 2.  EKG normal sinus rhythm at 66 bpm.  2. SOBOE (shortness of breath on exertion) Kelli Ferrell notes increasing shortness of breath with exercising and seems to be worsening over time with weight gain. She notes getting out of breath sooner with activity than she used to. This has not gotten worse recently. Kelli Ferrell denies shortness of breath at rest or orthopnea.  3. Hypothyroidism, unspecified type Kelli Ferrell is on Synthroid 100 mcg.  Last TSH was 4.40.  She denies cold or hot intolerance, or palpitations.  4. Vitamin D deficiency Kelli Ferrell is on a OTC vitamin D 2000 units daily.  Last vitamin D level was 36.1 in April 2023.  5. History of kidney stones Kelli Ferrell had kidney stones during both pregnancies.  No change in kidney function tests.  6. Mixed hyperlipidemia Syrina's last LDL was 111, HDL 31.7, and triglycerides 125.  She is not on medications.  7. Eosinophils increased Kelli Ferrell's last eosinophils increased to 21.  No similar values on prior labs.  Assessment/Plan:  1. Other fatigue Kelli Ferrell does feel that her weight is causing her energy to be lower than it should be. Fatigue may be related to obesity, depression or many other causes. Labs will be ordered, and in the meanwhile, Kelli Ferrell will focus on self care including making healthy food choices, increasing physical activity and focusing on stress reduction.  - EKG  12-Lead - Folate - Insulin, random  2. SOBOE (shortness of breath on exertion) Kelli Ferrell does feel that she gets out of breath more easily that she used to when she exercises. Kelli Ferrell's shortness of breath appears to be obesity related and exercise induced. She has agreed to work on weight loss and gradually increase exercise to treat her exercise induced shortness of breath. Will continue to monitor closely.  3. Hypothyroidism, unspecified type We will check labs today, and we will follow-up at [facility] next appointment.  - T4, free - T3  4. Vitamin D deficiency Kelli Ferrell will continue her OTC Vitamin D.  5. History of kidney stones Kelli Ferrell was encouraged to update her provider if symptoms return.  6. Mixed hyperlipidemia We will repeat labs in 3 months.  7. Eosinophils increased We will check labs today, and we will [facility] at [facility] next appointment.  - CBC with Differential/Platelet  8. Depression screening Kelli Ferrell had a positive depression screening. Depression is commonly associated with obesity and often results in emotional eating behaviors. We will monitor this closely and work on CBT to help improve the non-hunger eating patterns. Referral to Psychology may be required if no improvement is seen as she continues in our clinic.  9. Class 1 obesity with serious comorbidity and body mass index (BMI) of 34.0 to 34.9 in adult, unspecified obesity type Kelli Ferrell is currently in the action stage of change and her goal is to continue with weight loss efforts. I recommend Kelli Ferrell begin the structured treatment plan as follows:  She has agreed to the Category 2 Plan.  Exercise goals: No exercise has been prescribed at this time.   Behavioral modification strategies: increasing lean protein intake, meal planning and cooking strategies, keeping healthy foods in the home, and planning for success.  She was informed of the importance of frequent follow-up visits to  maximize her success with intensive lifestyle modifications for her multiple health conditions. She was informed we would discuss her lab results at her next visit unless there is a critical issue that needs to be addressed sooner. Kelli Ferrell agreed to keep her next visit at the agreed upon time to discuss these results.  Objective:   Blood pressure 95/64, pulse (!) 58, temperature 97.9 F (36.6 C), height '5\' 1"'$  (1.549 m), weight 182 lb (82.6 kg), last menstrual period 06/10/2021, SpO2 100 %. Body mass index is 34.39 kg/m.  EKG: Normal sinus rhythm, rate 66 BPM.  Indirect Calorimeter completed today shows a VO2 of 201 and a REE of 1382.  Her calculated basal metabolic rate is 1448 thus her basal metabolic rate is worse than expected.  General: Cooperative, alert, well developed, in no acute distress. HEENT: Conjunctivae and lids unremarkable. Cardiovascular: Regular rhythm.  Lungs: Normal work of breathing. Neurologic: No focal deficits.   Lab Results  Component Value Date   CREATININE 0.90 05/10/2021   BUN 9 05/10/2021   NA 139 05/10/2021   K 3.9 05/10/2021   CL 105 05/10/2021   CO2 27 05/10/2021   Lab Results  Component Value Date   ALT 17 05/10/2021   AST 14 05/10/2021   ALKPHOS 54 05/10/2021  BILITOT 0.4 05/10/2021   Lab Results  Component Value Date   HGBA1C 5.2 05/10/2021   HGBA1C 5.6 04/27/2020   HGBA1C 5.2 04/15/2019   HGBA1C 5.5 06/20/2017   HGBA1C 5.3 12/01/2015   Lab Results  Component Value Date   INSULIN 5.0 08/03/2021   Lab Results  Component Value Date   TSH 4.40 05/10/2021   Lab Results  Component Value Date   CHOL 167 05/10/2021   HDL 31.70 (L) 05/10/2021   LDLCALC 111 (H) 05/10/2021   TRIG 125.0 05/10/2021   CHOLHDL 5 05/10/2021   Lab Results  Component Value Date   WBC 5.9 08/03/2021   HGB 13.4 08/03/2021   HCT 42.1 08/03/2021   MCV 93 08/03/2021   PLT 318 08/03/2021   Lab Results  Component Value Date   IRON 58 06/16/2020   TIBC  351 06/16/2020   FERRITIN 40 06/16/2020   Attestation Statements:   Reviewed by clinician on day of visit: allergies, medications, problem list, medical history, surgical history, family history, social history, and previous encounter notes.  Time spent on visit including pre-visit chart review and post-visit charting and care was 45 minutes.   I, Trixie Dredge, am acting as transcriptionist for Coralie Common, MD.  This is the patient's first visit at [facility]. The patient's NEW PATIENT PACKET was reviewed at length. Included in the packet: current and past health history, medications, allergies, ROS, gynecologic history (women only), surgical history, family history, social history, weight history, weight loss surgery history (for those that have had weight loss surgery), nutritional evaluation, mood and food questionnaire, PHQ9, Epworth questionnaire, sleep habits questionnaire, patient life and health improvement goals questionnaire. These will all be scanned into the patient's chart under media.   During the visit, I independently reviewed the patient's EKG, bioimpedance scale results, and indirect calorimeter results. I used this information to tailor a meal plan for the patient that will help her to lose weight and will improve her obesity-related conditions going forward. I performed a medically necessary appropriate examination and/or evaluation. I discussed the assessment and treatment plan with the patient. The patient was provided an opportunity to ask questions and all were answered. The patient agreed with the plan and demonstrated an understanding of the instructions. Labs were ordered at this visit and will be reviewed at the next visit unless more critical results need to be addressed immediately. Clinical information was updated and documented in the EMR.   I have reviewed the above documentation for accuracy and completeness, and I agree with the above. -  Coralie Common, MD

## 2021-08-09 NOTE — Progress Notes (Incomplete)
Chief Complaint:   OBESITY Kelli Ferrell (MR# [id number]) is a 51 y.o. female who presents for evaluation and treatment of obesity and related comorbidities. Current BMI is Body mass index is 34.39 kg/m. Kelli Ferrell has been struggling with her weight for many years and has been unsuccessful in either losing weight, maintaining weight loss, or reaching her healthy weight goal.  Kelli Ferrell is currently in the action stage of change and ready to dedicate time achieving and maintaining a healthier weight. Kelli Ferrell is interested in becoming our patient and working on intensive lifestyle modifications including (but not limited to) diet and exercise for weight loss.  Kelli Ferrell's habits were reviewed today and are as follows: {MWM WT HABITS:23461}.  Depression Screen Kelli Ferrell (modified PHQ-9) score was ***.     08/03/2021    7:50 AM  Depression screen PHQ 2/9  Decreased Interest 3  Down, Depressed, Hopeless 1  PHQ - 2 Score 4  Altered sleeping 3  Tired, decreased energy 3  Change in appetite 3  Feeling bad or failure about yourself  1  Trouble concentrating 2  Moving slowly or fidgety/restless 0  Suicidal thoughts 0  PHQ-9 Score 16  Difficult doing work/chores Somewhat difficult   Subjective:   1. Other fatigue Kelli Ferrell {Actions; denies/reports/admits to:19208} daytime somnolence and {Actions; denies/reports/admits to:19208} waking up still tired. Patient has a history of symptoms of {OSA Sx:17850}. Kelli Ferrell generally gets {numbers (fuzzy):14653} hours of sleep per night, and states that she has {sleep quality:17851}. Snoring {is/are not:32546} present. Apneic episodes {is/are not:32546} present. [name] is ***.   2. SOBOE (shortness of breath on exertion) Kelli Ferrell notes increasing shortness of breath with exercising and seems to be worsening over time with weight gain. She notes getting out of breath sooner with activity than  she used to. This {HAS HAS NWG:95621} gotten worse recently. Kelli Ferrell denies shortness of breath at rest or orthopnea.  3. Hypothyroidism, unspecified type ***  4. Vitamin D deficiency ***  5. History of kidney stones ***  6. Mixed hyperlipidemia ***  7. Eosinophils increased ***  Assessment/Plan:   1. Other fatigue Kelli Ferrell {DOES_DOES HYQ:65784} feel that her weight is causing her energy to be lower than it should be. Fatigue may be related to obesity, depression or many other causes. Labs will be ordered, and in the meanwhile, Sharlon will focus on self care including making healthy food choices, increasing physical activity and focusing on stress reduction.  - EKG 12-Lead - Folate - Insulin, random  2. SOBOE (shortness of breath on exertion) Kelli Ferrell {DOES_DOES ONG:29528} feel that she gets out of breath more easily that she used to when she exercises. Kelli Ferrell shortness of breath appears to be obesity related and exercise induced. She has agreed to work on weight loss and gradually increase exercise to treat her exercise induced shortness of breath. Will continue to monitor closely.  3. Hypothyroidism, unspecified type *** - T4, free - T3  4. Vitamin D deficiency ***  5. History of kidney stones ***  6. Mixed hyperlipidemia ***  7. Eosinophils increased *** - CBC with Differential/Platelet  8. Depression screening Kelli Ferrell had a positive depression screening. Depression is commonly associated with obesity and often results in emotional eating behaviors. We will monitor this closely and work on CBT to help improve the non-hunger eating patterns. Referral to Psychology may be required if no improvement is seen as she continues in our clinic.  9. Class 1 obesity with serious comorbidity  and body mass index (BMI) of 34.0 to 34.9 in adult, unspecified obesity type Kelli Ferrell {CHL AMB IS/IS NOT:210130109} currently in the action stage of change and her goal is  to {MWMwtloss#1:210800005}. I recommend Kelli Ferrell begin the structured treatment plan as follows:  She has agreed to {MWMwtlossportion/plan2:23431}.  Exercise goals: {MWM EXERCISE RECS:23473}   Behavioral modification strategies: {MWMwtlossdietstrategies3:23432}.  She was informed of the importance of frequent follow-up visits to maximize her success with intensive lifestyle modifications for her multiple health conditions. She was informed we would discuss her lab results at her next visit unless there is a critical issue that needs to be addressed sooner. Kelli Ferrell agreed to keep her next visit at the agreed upon time to discuss these results.  Objective:   Blood pressure 95/64, pulse (!) 58, temperature 97.9 F (36.6 C), height '5\' 1"'$  (1.549 m), weight 182 lb (82.6 kg), last menstrual period 06/10/2021, SpO2 100 %. Body mass index is 34.39 kg/m.  EKG: Normal sinus rhythm, rate 66 BPM.  Indirect Calorimeter completed today shows a VO2 of 201 and a REE of 1382.  Her calculated basal metabolic rate is 6195 thus her basal metabolic rate is worse than expected.  General: Cooperative, alert, well developed, in no acute distress. HEENT: Conjunctivae and lids unremarkable. Cardiovascular: Regular rhythm.  Lungs: Normal work of breathing. Neurologic: No focal deficits.   Lab Results  Component Value Date   CREATININE 0.90 05/10/2021   BUN 9 05/10/2021   NA 139 05/10/2021   K 3.9 05/10/2021   CL 105 05/10/2021   CO2 27 05/10/2021   Lab Results  Component Value Date   ALT 17 05/10/2021   AST 14 05/10/2021   ALKPHOS 54 05/10/2021   BILITOT 0.4 05/10/2021   Lab Results  Component Value Date   HGBA1C 5.2 05/10/2021   HGBA1C 5.6 04/27/2020   HGBA1C 5.2 04/15/2019   HGBA1C 5.5 06/20/2017   HGBA1C 5.3 12/01/2015   Lab Results  Component Value Date   INSULIN 5.0 08/03/2021   Lab Results  Component Value Date   TSH 4.40 05/10/2021   Lab Results  Component Value Date   CHOL  167 05/10/2021   HDL 31.70 (L) 05/10/2021   LDLCALC 111 (H) 05/10/2021   TRIG 125.0 05/10/2021   CHOLHDL 5 05/10/2021   Lab Results  Component Value Date   WBC 5.9 08/03/2021   HGB 13.4 08/03/2021   HCT 42.1 08/03/2021   MCV 93 08/03/2021   PLT 318 08/03/2021   Lab Results  Component Value Date   IRON 58 06/16/2020   TIBC 351 06/16/2020   FERRITIN 40 06/16/2020   Attestation Statements:   Reviewed by clinician on day of visit: allergies, medications, problem list, medical history, surgical history, family history, social history, and previous encounter notes.  Time spent on visit including pre-visit chart review and post-visit charting and care was 45 minutes.   I, Trixie Dredge, am acting as transcriptionist for Coralie Common, MD.  I have reviewed the above documentation for accuracy and completeness, and I agree with the above. - ***

## 2021-08-10 ENCOUNTER — Ambulatory Visit: Payer: 59 | Admitting: Physician Assistant

## 2021-08-10 ENCOUNTER — Other Ambulatory Visit (INDEPENDENT_AMBULATORY_CARE_PROVIDER_SITE_OTHER): Payer: 59

## 2021-08-10 ENCOUNTER — Encounter: Payer: Self-pay | Admitting: Physician Assistant

## 2021-08-10 VITALS — BP 110/82 | HR 73 | Ht 61.0 in | Wt 184.0 lb

## 2021-08-10 DIAGNOSIS — Z86010 Personal history of colonic polyps: Secondary | ICD-10-CM

## 2021-08-10 DIAGNOSIS — K5909 Other constipation: Secondary | ICD-10-CM

## 2021-08-10 DIAGNOSIS — R194 Change in bowel habit: Secondary | ICD-10-CM

## 2021-08-10 DIAGNOSIS — Z860101 Personal history of adenomatous and serrated colon polyps: Secondary | ICD-10-CM

## 2021-08-10 LAB — BASIC METABOLIC PANEL
BUN: 10 mg/dL (ref 6–23)
CO2: 25 mEq/L (ref 19–32)
Calcium: 9.3 mg/dL (ref 8.4–10.5)
Chloride: 106 mEq/L (ref 96–112)
Creatinine, Ser: 0.88 mg/dL (ref 0.40–1.20)
GFR: 76.7 mL/min (ref >60.00)
Glucose, Bld: 91 mg/dL (ref 70–99)
Potassium: 4.4 mEq/L (ref 3.5–5.1)
Sodium: 138 mEq/L (ref 135–145)

## 2021-08-10 NOTE — Patient Instructions (Signed)
If you are age 50 or younger, your body mass index should be between 19-25. Your Body mass index is 34.77 kg/m. If this is out of the aformentioned range listed, please consider follow up with your Primary Care Provider.  ________________________________________________________  The [facility] providers would like to encourage you to use [facility] to communicate with providers for non-urgent requests or questions.  Due to long hold times on the telephone, sending your provider a message by [facility] may be a faster and more efficient way to get a response.  Please allow 48 business hours for a response.  Please remember that this is for non-urgent requests.  _______________________________________________________  Your provider has requested that you go to the basement level for lab work before leaving today. Press "B" on the elevator. The lab is located at the first door on the left as you exit the elevator.  You have been scheduled for a CT scan of the abdomen and pelvis at [facility], 1st floor Radiology. You are scheduled on 07/18/2021 at 4:00 pm. You should arrive 15 minutes prior to your appointment time for registration.  We are giving you 2 bottles of contrast today that you will need to drink before arriving for the exam. The solution may taste better if refrigerated so put them in the refrigerator when you get home, but do NOT add ice or any other liquid to this solution as that would dilute it. Shake well before drinking.   Please follow the written instructions below on the day of your exam:   1) Do not eat anything after 12:00 pm (4 hours prior to your test)   2) Drink 1 bottle of contrast @ 2:00 pm (2 hours prior to your exam)  Remember to shake well before drinking and do NOT pour over ice.     Drink 1 bottle of contrast @ 3:00 pm (1 hour prior to your exam)   You may take any medications as prescribed with a small amount of water, if necessary. If you take any of the following  medications: METFORMIN, GLUCOPHAGE, GLUCOVANCE, AVANDAMET, RIOMET, FORTAMET, [place] MET, JANUMET, GLUMETZA or METAGLIP, you MAY be asked to HOLD this medication 48 hours AFTER the exam.   The purpose of you drinking the oral contrast is to aid in the visualization of your intestinal tract. The contrast solution may cause some diarrhea. Depending on your individual set of symptoms, you may also receive an intravenous injection of x-ray contrast/dye. Plan on being at [facility] for 45 minutes or longer, depending on the type of exam you are having performed.   If you have any questions regarding your exam or if you need to reschedule, you may call [facility] at [phone] between the hours of 8:00 am and 5:00 pm, Monday-Friday.   Amy Esterwood recommends that you complete a bowel purge (to clean out your bowels). Please do the following: Purchase a bottle of Miralax over the counter.  You will then drink 6-8 capfuls of Miralax mixed in an adequate amount of water/juice/gatorade (you may choose which of these liquids to drink) over the next 2-3 hours. You should expect results within 1 to 6 hours after completing the bowel purge.  The day after completing your purge start Miralax 1 capful daily in 8 ounces of water or juice.  You may stop fiber.  We will call you about your CT and tell you the next steps.  Thank you for entrusting me with your care and choosing  [place].  Amy Esterwood, PA-C

## 2021-08-10 NOTE — Progress Notes (Signed)
 Subjective:    Patient ID: Kelli Ferrell, female    DOB: 10/25/1970, 51 y.o.   MRN: [id number]  HPI Kelli Ferrell is a pleasant 51-year-old female, established with Dr. Stark who was last seen in May 2021 when she underwent colonoscopy.  She was found to have 3 sessile polyps measuring 7 to 8 mm each and otherwise negative exam.  Path on the polyp showed 2 tubular adenomas and 1 sessile serrated polyp, and she is indicated for 3-year interval follow-up. Comes in today with primary complaint of constipation.  She says she was having normal and regular bowel movements until April 2023 when she fairly abruptly developed constipation which is persisting.  She has been using Dulcolax and castor oil as needed and says she may go as long as a week to a week and a half between bowel movements.  When she takes either Dulcolax or castor oil it works, she will have several bowel movements and then goes back to being constipated.  She has added a fiber supplement, is drinking 80 ounces of water per day.  She has also been on a diet over the past months and has lost about 40 pounds, following lower carb restrictions. He is on several vitamins/supplements but says has not started anything new in the past several months, she is not using any weight loss supplements.  She has on occasion seen a very small amount of bright red blood on the tissue only with straining.  She has had some intermittent lower abdominal pains but says it is hard for her to tell whether this is her intestine or coming from her uterus as she has fibroids and also has diagnosis of endometriosis.  She is not on any treatment for the endometriosis.  Recent T3 and T4 per PCP-normal  Review of Systems Pertinent positive and negative review of systems were noted in the above HPI section.  All other review of systems was otherwise negative.   Outpatient Encounter Medications as of 08/10/2021  Medication Sig   BIOTIN PO Take 1 tablet by mouth daily.    CALCIUM-MAGNESIUM-ZINC PO Take 1 tablet by mouth daily.   cetirizine (ZYRTEC) 10 MG tablet Take 10 mg by mouth daily.   Cholecalciferol (VITAMIN D) 50 MCG (2000 UT) tablet Take 2,000 Units by mouth daily.   Chromium Picolinate 800 MCG TABS Take 800 mcg by mouth daily.   Coenzyme Q10 (CO Q-10) 100 MG CAPS Take 100 mg by mouth.   Cyanocobalamin (B-12) 2500 MCG TABS Take 2,500 mcg by mouth.   DHEA 25 MG CAPS Take 25 mg by mouth daily.   levothyroxine (SYNTHROID) 100 MCG tablet TAKE 1 TABLET DAILY BEFORE BREAKFAST   Melatonin 10 MG TABS Take 1 tablet by mouth at bedtime as needed.   MILK THISTLE PO Take 1 tablet by mouth daily.   pantoprazole (PROTONIX) 40 MG tablet Take 1 tablet (40 mg total) by mouth daily. (Patient taking differently: Take 40 mg by mouth daily as needed.)   Potassium 99 MG TABS Take 1 tablet by mouth daily. OTC   Prenatal Vit-Fe Fumarate-FA (PRENATAL VITAMIN PO) Take 1 tablet by mouth daily.   Probiotic Product (PROBIOTIC PO) Take 1 tablet by mouth daily.   Turmeric Curcumin 500 MG CAPS Take 500 mg by mouth daily.   vitamin E 180 MG (400 UNITS) capsule Take 400 Units by mouth daily.   Ginger, Zingiber officinalis, (GINGER ROOT PO) Take 1 tablet by mouth daily.   No facility-administered encounter medications on   file as of 08/10/2021.   Allergies  Allergen Reactions   Bactrim [Sulfamethoxazole-Trimethoprim]    Patient Active Problem List   Diagnosis Date Noted   Excessive daytime sleepiness 07/01/2020   Sleep related bruxism 06/16/2020   Class 3 severe obesity due to excess calories without serious comorbidity with body mass index (BMI) of 40.0 to 44.9 in adult (HCC) 06/16/2020   RLS (restless legs syndrome) 06/16/2020   ADHD, adult residual type 06/16/2020   Non-restorative sleep 06/16/2020   Snoring 06/16/2020   Sleep related choking sensation 06/16/2020   Uterine leiomyoma 05/06/2019   Pain in pelvis 05/06/2019   Abnormal mammogram 05/06/2019   Morbid obesity  with BMI of 40.0-44.9, adult (HCC) 05/06/2019   Odynophagia 06/14/2016   Rectal bleeding 06/14/2016   Seasonal allergies 11/22/2015   Thyroid activity decreased 11/18/2014   Morbid obesity (HCC) 11/18/2014   Dyslipidemia 11/18/2014   Social History   Socioeconomic History   Marital status: Married    Spouse name: Not on file   Number of children: 4   Years of education: some college   Highest education level: Some college, no degree  Occupational History   Occupation: account manager  Tobacco Use   Smoking status: Never   Smokeless tobacco: Never  Vaping Use   Vaping Use: Never used  Substance and Sexual Activity   Alcohol use: Yes    Comment: occasional   Drug use: Never   Sexual activity: Yes    Birth control/protection: Surgical    Comment: tubal  Other Topics Concern   Not on file  Social History Narrative   Work or School: [employer] account manager      Home Situation: lives with husband and two children      Spiritual Beliefs: Christian      Lifestyle: regular exercise and healthy diet      Right-handed.      Two cups caffeine per day.         Social Determinants of Health   Financial Resource Strain: Low Risk  (05/06/2021)   Overall Financial Resource Strain (CARDIA)    Difficulty of Paying Living Expenses: Not hard at all  Food Insecurity: No Food Insecurity (05/06/2021)   Hunger Vital Sign    Worried About Running Out of Food in the Last Year: Never true    Ran Out of Food in the Last Year: Never true  Transportation Needs: No Transportation Needs (05/06/2021)   PRAPARE - Transportation    Lack of Transportation (Medical): No    Lack of Transportation (Non-Medical): No  Physical Activity: Insufficiently Active (05/06/2021)   Exercise Vital Sign    Days of Exercise per Week: 6 days    Minutes of Exercise per Session: 20 min  Stress: Stress Concern Present (05/06/2021)   [employer] - Occupational Stress  Questionnaire    Feeling of Stress : To some extent  Social Connections: Socially Integrated (05/06/2021)   Social Connection and Isolation Panel [NHANES]    Frequency of Communication with Friends and Family: More than three times a week    Frequency of Social Gatherings with Friends and Family: Twice a week    Attends Religious Services: More than 4 times per year    Active Member of Clubs or Organizations: Yes    Attends Club or Organization Meetings: More than 4 times per year    Marital Status: Married  Intimate Partner Violence: Not on file    Ms. Boze's family history includes Diabetes   in her brother, father, and mother; Hyperlipidemia in her mother; Hypertension in her mother; Sleep apnea in her mother; Stroke in her mother; Thyroid disease in her mother.      Objective:    Vitals:   08/10/21 0829  BP: 110/82  Pulse: 73  SpO2: 98%    Physical Exam Well-developed well-nourished female in no acute distress.  Height, Weight,184 BMI34.7  HEENT; nontraumatic normocephalic, EOMI, PE R LA, sclera anicteric. Oropharynx; not done Neck; supple, no JVD Cardiovascular; regular rate and rhythm with S1-S2, no murmur rub or gallop Pulmonary; Clear bilaterally Abdomen; soft, there is some very mild tenderness across the lower abdomen, no guarding, nondistended, no palpable mass or hepatosplenomegaly, bowel sounds are active Rectal; not done today Skin; benign exam, no jaundice rash or appreciable lesions Extremities; no clubbing cyanosis or edema skin warm and dry Neuro/Psych; alert and oriented x4, grossly nonfocal mood and affect appropriate        Assessment & Plan:   #1 51-year-old female with new onset of constipation x4 months, has never had issues in the past. She has also been experiencing intermittent lower abdominal pain but relates having uterine fibroids and endometriosis and is uncertain if the pain may be coming from those conditions.  Etiology of the  constipation is not clear, she did change her diet but has been continuing to eat plenty of fiber. History of hypothyroidism-recent T3-T4 within normal limits  Rule out new functional constipation, rule out other intra-abdominal/pelvic pathology, rule out occult neoplasm  #2 history of tubular adenomatous and sessile serrated polyps-due for follow-up colonoscopy May 2024 #3 restless leg syndrome #4 hypothyroidism  Plan; will start with a MiraLAX bowel purge, then start MiraLAX 17 g in 8 ounces of water daily.  She may increase to twice daily if needed to have a bowel movement every 1 to 2 days. Be met today Scheduled for CT of the abdomen and pelvis with contrast.  If CT is unrevealing, will proceed with scheduling for colonoscopy with Dr. Stark.  Amy S Esterwood PA-C 08/10/2021   Cc: Hernandez Acosta, [place]*   

## 2021-08-18 ENCOUNTER — Ambulatory Visit (HOSPITAL_COMMUNITY)
Admission: RE | Admit: 2021-08-18 | Discharge: 2021-08-18 | Disposition: A | Discharge status: Home / Self Care | Payer: 59 | Source: Ambulatory Visit | Attending: Physician Assistant | Admitting: Physician Assistant

## 2021-08-18 DIAGNOSIS — Z86010 Personal history of colonic polyps: Secondary | ICD-10-CM | POA: Diagnosis present

## 2021-08-18 DIAGNOSIS — R194 Change in bowel habit: Secondary | ICD-10-CM | POA: Insufficient documentation

## 2021-08-18 DIAGNOSIS — K5909 Other constipation: Secondary | ICD-10-CM | POA: Insufficient documentation

## 2021-08-18 MED ORDER — IOHEXOL 300 MG/ML  SOLN
100.0000 mL | Freq: Once | INTRAMUSCULAR | Status: AC | PRN
  Administered 2021-08-18: 100 mL via INTRAVENOUS

## 2021-08-18 MED ORDER — SODIUM CHLORIDE (PF) 0.9 % IJ SOLN
INTRAMUSCULAR | Status: AC
  Filled 2021-08-18: qty 50

## 2021-08-21 ENCOUNTER — Ambulatory Visit (INDEPENDENT_AMBULATORY_CARE_PROVIDER_SITE_OTHER): Payer: 59 | Admitting: Family Medicine

## 2021-08-23 ENCOUNTER — Telehealth: Payer: Self-pay

## 2021-08-23 ENCOUNTER — Encounter (INDEPENDENT_AMBULATORY_CARE_PROVIDER_SITE_OTHER): Payer: Self-pay

## 2021-08-23 NOTE — Telephone Encounter (Signed)
-----   Message from Alfredia Ferguson, PA-C sent at 08/21/2021  2:46 PM EDT ----- Please call pt and let her know the Ct scan shows  moderate amt of stool in colon, no sign of obstruction - she has a 3.2 cm left adnexal cyst and should follow up with her GYN for that . 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  Please call her and see if doing better after Miralax purge and getting stated on Miralax. We had discussed scheduling her for colonoscopy with DR Fuller Plan and I would like her to go ahead with that

## 2021-08-23 NOTE — Telephone Encounter (Signed)
Spoke with patient. She reports she is taking daily Miralax now. She will still go 2 to 3 days with now bowel movement. When this happens, she will take extra Miralax and Dulcolax. This creates a "blow out" the next day. Some days she may have 3 bowel movements. She is interested in having the colonoscopy with Dr Fuller Plan. I will get her scheduled. Is there anything else you want her to do for her issues right now?

## 2021-08-23 NOTE — Telephone Encounter (Signed)
Called the patient. No answer. Left her a voicemail offering these options. Asked she call back with her decision.

## 2021-08-24 ENCOUNTER — Ambulatory Visit (INDEPENDENT_AMBULATORY_CARE_PROVIDER_SITE_OTHER): Payer: 59 | Admitting: Family Medicine

## 2021-08-24 VITALS — BP 115/76 | HR 65 | Temp 98.2°F | Ht 61.0 in | Wt 179.0 lb

## 2021-08-24 DIAGNOSIS — E038 Other specified hypothyroidism: Secondary | ICD-10-CM | POA: Diagnosis not present

## 2021-08-24 DIAGNOSIS — E559 Vitamin D deficiency, unspecified: Secondary | ICD-10-CM | POA: Diagnosis not present

## 2021-08-24 DIAGNOSIS — E669 Obesity, unspecified: Secondary | ICD-10-CM | POA: Diagnosis not present

## 2021-08-24 DIAGNOSIS — Z6834 Body mass index (BMI) 34.0-34.9, adult: Secondary | ICD-10-CM

## 2021-08-24 DIAGNOSIS — E785 Hyperlipidemia, unspecified: Secondary | ICD-10-CM

## 2021-08-24 DIAGNOSIS — Z6833 Body mass index (BMI) 33.0-33.9, adult: Secondary | ICD-10-CM

## 2021-08-24 MED ORDER — VITAMIN D3 125 MCG (5000 UT) PO CAPS: 5000.0000 [IU] | ORAL_CAPSULE | Freq: Every day | ORAL | Status: AC

## 2021-08-31 NOTE — Progress Notes (Signed)
Chief Complaint:   OBESITY Kelli Ferrell is here to discuss her progress with her obesity treatment plan along with follow-up of her obesity related diagnoses. Kelli Ferrell is on the Category 2 Plan and states she is following her eating plan approximately 95% of the time. Kelli Ferrell states she is walking her dogs 15-20 minutes 3-4 times per week.  Today's visit was #: 2 Starting weight: 182 lbs Starting date: 08/03/2021 Today's weight: 179 lbs Today's date: 08/24/2021 Total lbs lost to date: 3 lbs Total lbs lost since last in-office visit: 3  Interim History: Kelli Ferrell felt meal plan was very close to what she was already eating. She did add bread. Has not gotten all food in on the plan during the day. No big upcoming events or travel. Calculated about 1000 cal/day.  Subjective:   1. Vitamin D deficiency Kelli Ferrell is currently taking Vit D 2K IU daily. Denies any nausea, vomiting or muscle weakness. She notes fatigue. Her ladt Vit D level of 36.  2. Dyslipidemia Kelli Ferrell's LDL of 111, HDL of 31. She had lost a considerable amount of weight prior to coming to [facility].  3. Other specified hypothyroidism Kelli Ferrell is currently on Levothyroxine 100 mcg by mouth daily. Denies any chest pain,shortness of breath or palpitations.  Assessment/Plan:   1. Vitamin D deficiency Kelli Ferrell will STOP Vit D 2K IU daily. Change to over the counter Vit D 5K IU/ daily. We will refill for 1 month with 0 refills.  -Refill Cholecalciferol (VITAMIN D3) 125 MCG (5000 UT) CAPS; Take 1 capsule (5,000 Units total) by mouth daily.  Dispense: 30 capsule  2. Dyslipidemia We will obtain labs in 3 months.  3. Other specified hypothyroidism Kelli Ferrell will continue with current dose. We will repeat labs in 3 months.  4. Obesity with current BMI of 33.9 Kelli Ferrell is currently in the action stage of change. As such, her goal is to continue with weight loss efforts. She has agreed to the  Category 2 Plan and keeping a food journal and adhering to recommended goals of 1100-1200 calories and 80+ grams of protein daily.  Exercise goals: All adults should avoid inactivity. Some physical activity is better than none, and adults who participate in any amount of physical activity gain some health benefits.  Behavioral modification strategies: increasing lean protein intake, meal planning and cooking strategies, keeping healthy foods in the home, and planning for success.  Kelli Ferrell has agreed to follow-up with our clinic in 2 weeks. She was informed of the importance of frequent follow-up visits to maximize her success with intensive lifestyle modifications for her multiple health conditions.   Objective:   Blood pressure 115/76, pulse 65, temperature 98.2 F (36.8 C), height '5\' 1"'$  (1.549 m), weight 179 lb (81.2 kg), last menstrual period 06/10/2021, SpO2 99 %. Body mass index is 33.82 kg/m.  General: Cooperative, alert, well developed, in no acute distress. HEENT: Conjunctivae and lids unremarkable. Cardiovascular: Regular rhythm.  Lungs: Normal work of breathing. Neurologic: No focal deficits.   Lab Results  Component Value Date   CREATININE 0.88 08/10/2021   BUN 10 08/10/2021   NA 138 08/10/2021   K 4.4 08/10/2021   CL 106 08/10/2021   CO2 25 08/10/2021   Lab Results  Component Value Date   ALT 17 05/10/2021   AST 14 05/10/2021   ALKPHOS 54 05/10/2021   BILITOT 0.4 05/10/2021   Lab Results  Component Value Date   HGBA1C 5.2 05/10/2021   HGBA1C 5.6 04/27/2020  HGBA1C 5.2 04/15/2019   HGBA1C 5.5 06/20/2017   HGBA1C 5.3 12/01/2015   Lab Results  Component Value Date   INSULIN 5.0 08/03/2021   Lab Results  Component Value Date   TSH 4.40 05/10/2021   Lab Results  Component Value Date   CHOL 167 05/10/2021   HDL 31.70 (L) 05/10/2021   LDLCALC 111 (H) 05/10/2021   TRIG 125.0 05/10/2021   CHOLHDL 5 05/10/2021   Lab Results  Component Value Date    VD25OH 36.10 05/10/2021   VD25OH 35.75 04/27/2020   VD25OH 35.51 04/15/2019   Lab Results  Component Value Date   WBC 5.9 08/03/2021   HGB 13.4 08/03/2021   HCT 42.1 08/03/2021   MCV 93 08/03/2021   PLT 318 08/03/2021   Lab Results  Component Value Date   IRON 58 06/16/2020   TIBC 351 06/16/2020   FERRITIN 40 06/16/2020   Attestation Statements:   Reviewed by clinician on day of visit: allergies, medications, problem list, medical history, surgical history, family history, social history, and previous encounter notes.  Time spent on visit including pre-visit chart review and post-visit care and charting was 25 minutes.   I, Elnora Morrison, RMA am acting as transcriptionist for Coralie Common, MD.  I have reviewed the above documentation for accuracy and completeness, and I agree with the above. - Coralie Common, MD

## 2021-09-14 ENCOUNTER — Ambulatory Visit (INDEPENDENT_AMBULATORY_CARE_PROVIDER_SITE_OTHER): Payer: 59 | Admitting: Family Medicine

## 2021-10-09 ENCOUNTER — Ambulatory Visit (INDEPENDENT_AMBULATORY_CARE_PROVIDER_SITE_OTHER): Payer: 59 | Admitting: Family Medicine

## 2022-04-25 ENCOUNTER — Encounter: Payer: Self-pay | Admitting: Gastroenterology

## 2022-05-07 ENCOUNTER — Encounter: Payer: Self-pay | Admitting: Internal Medicine

## 2022-05-07 ENCOUNTER — Ambulatory Visit (INDEPENDENT_AMBULATORY_CARE_PROVIDER_SITE_OTHER): Payer: 59 | Admitting: Internal Medicine

## 2022-05-07 VITALS — BP 120/72 | HR 69 | Ht 61.0 in | Wt 181.0 lb

## 2022-05-07 DIAGNOSIS — D1779 Benign lipomatous neoplasm of other sites: Secondary | ICD-10-CM | POA: Diagnosis not present

## 2022-05-07 DIAGNOSIS — E039 Hypothyroidism, unspecified: Secondary | ICD-10-CM | POA: Diagnosis not present

## 2022-05-07 DIAGNOSIS — E063 Autoimmune thyroiditis: Secondary | ICD-10-CM | POA: Diagnosis not present

## 2022-05-07 NOTE — Patient Instructions (Signed)

## 2022-05-07 NOTE — Progress Notes (Signed)
Name: Kelli Ferrell  MRN/ DOB: [id number], 1970-10-03    Age/ Sex: 52 y.o., female    PCP: Philip Aspen, Limmie Patricia, MD   Reason for Endocrinology Evaluation: Hypothyroidism     Date of Initial Endocrinology Evaluation: 05/07/2022     HPI: Ms. Kelli Ferrell is a 52 y.o. female with a past medical history of Dyslipidemia, OSA, hypothyroidism. The patient presented for initial endocrinology clinic visit on 05/07/2022 for consultative assistance with her Hypothyroidism.   Patient has been referred here for further management of hypothyroid which she was diagnosed in 1994 right after the birth of her daughter   She was having difficulty losing weight, she [facility], but was not comfortable with the recommendations as she was asked to increase carbohydrate intake  Over the past year her weight has been stable, and she continues to follow a healthy lifestyle She started a research study 10/2021 through [facility] in [place], the study is regarding desiccated thyroid hormone (progressive medicine) , this is a blinded study and will complete next month.   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  Right gland is normal   No biotin  Denies local neck swelling  Has occasional sore throat  Has occasional palpitation Has chronic constipation  Denies tremors     Sister with thyroid cancer  Mother with thyroid disease initially hypothyroidism followed by hyperthyroidism  + FH of T1DM      HISTORY:  Past Medical History:  Past Medical History:  Diagnosis Date   Dyslipidemia 11/18/2014   Endometriosis    Fibroid, uterine    GERD (gastroesophageal reflux disease)    High cholesterol    History of colon polyps    History of kidney stones    History of positive PPD 2017   -neg skin test and neg xray -seen by health department and they offered but did not feel  treatment was needed - she opted against treatment for POSSIBLE latent TB -in 2017   History of uterine fibroid    Hypothyroidism    followed by pcp   Mild obstructive sleep apnea    study in epic 06-29-2020 , per pt has not decided type of treatment recommended yet   Ovarian cyst    Pelvic pain in female    Seasonal allergies 11/22/2015   SOB (shortness of breath)    Vitamin D deficiency    Past Surgical History:  Past Surgical History:  Procedure Laterality Date   ABLATION ON ENDOMETRIOSIS N/A 08/26/2020   Procedure: ABLATION ON ENDOMETRIOSIS;  Surgeon: Edwinna Areola, DO;  Location: [facility];  Service: Gynecology;  Laterality: N/A;   COLONOSCOPY  04/2019   DILATION AND CURETTAGE OF UTERUS  2021   LAPAROSCOPIC LYSIS OF ADHESIONS N/A 08/26/2020   Procedure: LAPAROSCOPIC LYSIS OF ADHESIONS;  Surgeon: Edwinna Areola, DO;  Location: [facility];  Service: Gynecology;  Laterality: N/A;   LAPAROSCOPIC OVARIAN CYSTECTOMY Bilateral 08/26/2020   Procedure: LAPAROSCOPIC OVARIAN CYSTECTOMY;  Surgeon: Edwinna Areola, DO;  Location: [facility];  Service: Gynecology;  Laterality: Bilateral;   LAPAROSCOPY N/A 08/26/2020   Procedure: LAPAROSCOPY DIAGNOSTIC;  Surgeon: Edwinna Areola, DO;  Location: [facility];  Service: Gynecology;  Laterality: N/A;   NASAL SINUS SURGERY  2007   TUBAL LIGATION Bilateral 2001   UTERINE FIBROID EMBOLIZATION  2014  Social History:  reports that she has never smoked. She has never used smokeless tobacco. She reports current alcohol use. She reports that she does not use drugs. Family History: family history includes Diabetes in her brother, father, and mother; Hyperlipidemia in her mother; Hypertension in her mother; Sleep apnea in her mother; Stroke in her mother; Thyroid disease in her mother.   HOME MEDICATIONS: Allergies as of 05/07/2022       Reactions   Bactrim  [sulfamethoxazole-trimethoprim]         Medication List        Accurate as of May 07, 2022  1:07 PM. If you have any questions, ask your nurse or doctor.          STOP taking these medications    GINGER ROOT PO Stopped by: Scarlette Shorts, MD   pantoprazole 40 MG tablet Commonly known as: PROTONIX Stopped by: Scarlette Shorts, MD   PRENATAL VITAMIN PO Stopped by: Scarlette Shorts, MD       TAKE these medications    B-12 2500 MCG Tabs Take 2,500 mcg by mouth.   BIOTIN PO Take 1 tablet by mouth daily.   CALCIUM-MAGNESIUM-ZINC PO Take 1 tablet by mouth daily.   cetirizine 10 MG tablet Commonly known as: ZYRTEC Take 10 mg by mouth daily.   Chromium Picolinate 800 MCG Tabs Take 800 mcg by mouth daily.   Co Q-10 100 MG Caps Take 100 mg by mouth.   DHEA 25 MG Caps Take 25 mg by mouth daily.   levothyroxine 100 MCG tablet Commonly known as: SYNTHROID TAKE 1 TABLET DAILY BEFORE BREAKFAST   Melatonin 10 MG Tabs Take 1 tablet by mouth at bedtime as needed.   MILK THISTLE PO Take 1 tablet by mouth daily.   Potassium 99 MG Tabs Take 1 tablet by mouth daily. OTC   PROBIOTIC PO Take 1 tablet by mouth daily.   Turmeric Curcumin 500 MG Caps Take 500 mg by mouth daily.   Vitamin D3 125 MCG (5000 UT) Caps Take 1 capsule (5,000 Units total) by mouth daily.   vitamin E 180 MG (400 UNITS) capsule Generic drug: vitamin E Take 400 Units by mouth daily.          REVIEW OF SYSTEMS: A comprehensive ROS was conducted with the patient and is negative except as per HPI      OBJECTIVE:  VS: BP 120/72 (BP Location: Left Arm, Patient Position: Sitting, Cuff Size: Large)   Pulse 69   Ht  (1.549 m)   Wt 181 lb (82.1 kg)   SpO2 99%   BMI 34.20 kg/m    Wt Readings from Last 3 Encounters:  05/07/22 181 lb (82.1 kg)  08/24/21 179 lb (81.2 kg)  08/10/21 184 lb (83.5 kg)     EXAM: General: Pt appears well and is in NAD  Eyes:  External eye exam normal without stare, lid lag or exophthalmos.  EOM intact.  Neck: General: Supple without adenopathy. Thyroid: Thyroid size normal.  No goiter or nodules appreciated  Lungs: Clear with good BS bilat   Heart: Auscultation: RRR.  Abdomen:  soft, nontender  Extremities:  BL LE: No pretibial edema   Mental Status: Judgment, insight: Intact Orientation: Oriented to time, place, and person Mood and affect: No depression, anxiety, or agitation     DATA REVIEWED:  03/23/2022 TSH 0.9  T3 163.8    CT abdomen 08/19/2022  Adrenals/Urinary Tract: 19 mm macroscopic fat containing benign  left adrenal myelolipoma noted. The adrenal glands are otherwise unremarkable. The kidneys are normal. The bladder is unremarkable.  ASSESSMENT/PLAN/RECOMMENDATIONS:   Hypothyroidism:  -Patient is clinically euthyroid -Due to mother with hypo-/hyperthyroid, we will proceed with TPO antibody testing -Her sister has thyroid cancer, unknown type, slight asymmetry on the right of the thyroid gland today, will proceed with thyroid ultrasound -I will not check TFTs, as she is under a research study for desiccated thyroid hormone, her most recent TSH was within normal range  2.  Left adrenal myelolipoma  -The risk of hormonal excretion is very minute -We will proceed with Aldo/renin and 24-hour urinary cortisol metanephrines -Reassurance provided at this time   Follow-up in 6 months Signed electronically by: Lyndle Herrlich, MD  [facility] Endocrinology  [facility] [address] [place] [place], [place] [address] Phone: [phone] FAX: [phone]   CC: Philip Aspen, Limmie Patricia, MD [address] [place] [address] Phone: [phone] Fax: [phone]   Return to Endocrinology clinic as below: Future Appointments  Date Time Provider Department Center  05/14/2022  8:00 AM Philip Aspen, Limmie Patricia, MD [id number]

## 2022-05-13 LAB — ALDOSTERONE + RENIN ACTIVITY W/ RATIO
Aldos/Renin Ratio: 8.3 (ref 0.0–30.0)
Aldosterone: 5.1 ng/dL (ref 0.0–30.0)
Renin Activity, Plasma: 0.611 ng/mL/hr (ref 0.167–5.380)

## 2022-05-13 LAB — THYROID PEROXIDASE ANTIBODY: Thyroperoxidase Ab SerPl-aCnc: 62 IU/mL — ABNORMAL HIGH (ref 0–34)

## 2022-05-14 ENCOUNTER — Other Ambulatory Visit (INDEPENDENT_AMBULATORY_CARE_PROVIDER_SITE_OTHER): Payer: 59

## 2022-05-14 ENCOUNTER — Encounter: Payer: Self-pay | Admitting: Internal Medicine

## 2022-05-14 ENCOUNTER — Ambulatory Visit (INDEPENDENT_AMBULATORY_CARE_PROVIDER_SITE_OTHER): Payer: 59 | Admitting: Internal Medicine

## 2022-05-14 VITALS — BP 122/80 | HR 78 | Temp 98.1°F | Ht 60.63 in | Wt 181.5 lb

## 2022-05-14 DIAGNOSIS — D1779 Benign lipomatous neoplasm of other sites: Secondary | ICD-10-CM

## 2022-05-14 DIAGNOSIS — E785 Hyperlipidemia, unspecified: Secondary | ICD-10-CM

## 2022-05-14 DIAGNOSIS — K219 Gastro-esophageal reflux disease without esophagitis: Secondary | ICD-10-CM

## 2022-05-14 DIAGNOSIS — E039 Hypothyroidism, unspecified: Secondary | ICD-10-CM | POA: Diagnosis not present

## 2022-05-14 DIAGNOSIS — Z0000 Encounter for general adult medical examination without abnormal findings: Secondary | ICD-10-CM | POA: Diagnosis not present

## 2022-05-14 DIAGNOSIS — Z23 Encounter for immunization: Secondary | ICD-10-CM | POA: Diagnosis not present

## 2022-05-14 DIAGNOSIS — Z1159 Encounter for screening for other viral diseases: Secondary | ICD-10-CM

## 2022-05-14 LAB — COMPREHENSIVE METABOLIC PANEL
ALT: 14 U/L (ref 0–35)
AST: 14 U/L (ref 0–37)
Albumin: 3.8 g/dL (ref 3.5–5.2)
Alkaline Phosphatase: 44 U/L (ref 39–117)
BUN: 12 mg/dL (ref 6–23)
CO2: 26 mEq/L (ref 19–32)
Calcium: 8.9 mg/dL (ref 8.4–10.5)
Chloride: 105 mEq/L (ref 96–112)
Creatinine, Ser: 0.84 mg/dL (ref 0.40–1.20)
GFR: 80.67 mL/min (ref >60.00)
Glucose, Bld: 94 mg/dL (ref 70–99)
Potassium: 3.9 mEq/L (ref 3.5–5.1)
Sodium: 138 mEq/L (ref 135–145)
Total Bilirubin: 0.4 mg/dL (ref 0.2–1.2)
Total Protein: 6.5 g/dL (ref 6.0–8.3)

## 2022-05-14 LAB — CBC WITH DIFFERENTIAL/PLATELET
Basophils Absolute: 0 10*3/uL (ref 0.0–0.1)
Basophils Relative: 0.5 % (ref 0.0–3.0)
Eosinophils Absolute: 0.1 10*3/uL (ref 0.0–0.7)
Eosinophils Relative: 1.2 % (ref 0.0–5.0)
HCT: 40.9 % (ref 36.0–46.0)
Hemoglobin: 14.1 g/dL (ref 12.0–15.0)
Lymphocytes Relative: 31.8 % (ref 12.0–46.0)
Lymphs Abs: 1.5 10*3/uL (ref 0.7–4.0)
MCHC: 34.4 g/dL (ref 30.0–36.0)
MCV: 89.2 fl (ref 78.0–100.0)
Monocytes Absolute: 0.4 10*3/uL (ref 0.1–1.0)
Monocytes Relative: 9.1 % (ref 3.0–12.0)
Neutro Abs: 2.6 10*3/uL (ref 1.4–7.7)
Neutrophils Relative %: 57.4 % (ref 43.0–77.0)
Platelets: 304 10*3/uL (ref 150.0–400.0)
RBC: 4.59 Mil/uL (ref 3.87–5.11)
RDW: 13.4 % (ref 11.5–15.5)
WBC: 4.6 10*3/uL (ref 4.0–10.5)

## 2022-05-14 LAB — VITAMIN B12: Vitamin B-12: 603 pg/mL (ref 211–911)

## 2022-05-14 LAB — LIPID PANEL
Cholesterol: 170 mg/dL (ref 0–200)
HDL: 41.8 mg/dL (ref >39.00)
LDL Cholesterol: 115 mg/dL — ABNORMAL HIGH (ref 0–99)
NonHDL: 128.23
Total CHOL/HDL Ratio: 4
Triglycerides: 67 mg/dL (ref 0.0–149.0)
VLDL: 13.4 mg/dL (ref 0.0–40.0)

## 2022-05-14 LAB — VITAMIN D 25 HYDROXY (VIT D DEFICIENCY, FRACTURES): VITD: 38.65 ng/mL (ref 30.00–100.00)

## 2022-05-14 LAB — TSH: TSH: 0.97 u[IU]/mL (ref 0.35–5.50)

## 2022-05-14 NOTE — Addendum Note (Signed)
Addended by: Christy Sartorius on: 05/14/2022 08:46 AM   Modules accepted: Orders

## 2022-05-14 NOTE — Progress Notes (Unsigned)
Start date 05/11/2022 end date 05/12/2022 Start time 7am End time 815 Total volume 1600 mL

## 2022-05-14 NOTE — Progress Notes (Signed)
Established Patient Office Visit     CC/Reason for Visit: Annual preventive exam  HPI: Kelli Ferrell is a 52 y.o. female who is coming in today for the above mentioned reasons. Past Medical History is significant for: Hypothyroidism followed by endocrinology obesity and GERD.  She is feeling well and has no acute concerns or complaints.  She visits with GYN routinely, does mammograms through them, obtain records.  Had a colonoscopy in 2021, due for shingles vaccine.   Past Medical/Surgical History: Past Medical History:  Diagnosis Date   Dyslipidemia 11/18/2014   Endometriosis    Fibroid, uterine    GERD (gastroesophageal reflux disease)    High cholesterol    History of colon polyps    History of kidney stones    History of positive PPD 2017   -neg skin test and neg xray -seen by health department and they offered but did not feel treatment was needed - she opted against treatment for POSSIBLE latent TB -in 2017   History of uterine fibroid    Hypothyroidism    followed by pcp   Mild obstructive sleep apnea    study in epic 06-29-2020 , per pt has not decided type of treatment recommended yet   Ovarian cyst    Pelvic pain in female    Seasonal allergies 11/22/2015   SOB (shortness of breath)    Vitamin D deficiency     Past Surgical History:  Procedure Laterality Date   ABLATION ON ENDOMETRIOSIS N/A 08/26/2020   Procedure: ABLATION ON ENDOMETRIOSIS;  Surgeon: Edwinna Areola, DO;  Location: [facility];  Service: Gynecology;  Laterality: N/A;   COLONOSCOPY  04/2019   DILATION AND CURETTAGE OF UTERUS  2021   LAPAROSCOPIC LYSIS OF ADHESIONS N/A 08/26/2020   Procedure: LAPAROSCOPIC LYSIS OF ADHESIONS;  Surgeon: Edwinna Areola, DO;  Location: [facility];  Service: Gynecology;  Laterality: N/A;   LAPAROSCOPIC OVARIAN CYSTECTOMY Bilateral 08/26/2020   Procedure: LAPAROSCOPIC OVARIAN CYSTECTOMY;  Surgeon: Edwinna Areola, DO;  Location: [facility];  Service: Gynecology;  Laterality: Bilateral;   LAPAROSCOPY N/A 08/26/2020   Procedure: LAPAROSCOPY DIAGNOSTIC;  Surgeon: Edwinna Areola, DO;  Location:  [facility];  Service: Gynecology;  Laterality: N/A;   NASAL SINUS SURGERY  2007   TUBAL LIGATION Bilateral 2001   UTERINE FIBROID EMBOLIZATION  2014    Social History:  reports that she has never smoked. She has never used smokeless tobacco. She reports current alcohol use. She reports that she does not use drugs.  Allergies: Allergies  Allergen Reactions   Bactrim [Sulfamethoxazole-Trimethoprim]     Family History:  Family History  Problem Relation Age of Onset   Hyperlipidemia Mother    Diabetes Mother    Stroke Mother    Hypertension Mother    Thyroid disease Mother    Sleep apnea Mother    Diabetes Father    Diabetes Brother    Colon cancer Neg Hx    Colon polyps Neg Hx    Esophageal cancer Neg Hx    Rectal cancer Neg Hx    Stomach cancer Neg Hx      Current Outpatient Medications:    BIOTIN PO, Take 1 tablet by mouth daily., Disp: , Rfl:    CALCIUM-MAGNESIUM-ZINC PO, Take 1 tablet by mouth daily., Disp: , Rfl:    cetirizine (ZYRTEC) 10 MG tablet, Take 10 mg by mouth daily., Disp: , Rfl:    Cholecalciferol (  VITAMIN D3) 125 MCG (5000 UT) CAPS, Take 1 capsule (5,000 Units total) by mouth daily., Disp: 30 capsule, Rfl:    Chromium Picolinate 800 MCG TABS, Take 800 mcg by mouth daily., Disp: , Rfl:    Coenzyme Q10 (CO Q-10) 100 MG CAPS, Take 100 mg by mouth., Disp: , Rfl:    Cyanocobalamin (B-12) 2500 MCG TABS, Take 2,500 mcg by mouth., Disp: , Rfl:    DHEA 25 MG CAPS, Take 25 mg by mouth daily., Disp: , Rfl:    levothyroxine (SYNTHROID) 100 MCG tablet, TAKE 1 TABLET DAILY BEFORE BREAKFAST, Disp: 90 tablet, Rfl: 1   Melatonin 10 MG TABS, Take 1 tablet by mouth at bedtime as needed., Disp: , Rfl:    MILK THISTLE PO, Take 1 tablet by mouth daily.,  Disp: , Rfl:    Potassium 99 MG TABS, Take 1 tablet by mouth daily. OTC, Disp: , Rfl:    Probiotic Product (PROBIOTIC PO), Take 1 tablet by mouth daily., Disp: , Rfl:    Turmeric Curcumin 500 MG CAPS, Take 500 mg by mouth daily., Disp: , Rfl:    vitamin E 180 MG (400 UNITS) capsule, Take 400 Units by mouth daily., Disp: , Rfl:   Review of Systems:  Negative unless indicated in HPI.   Physical Exam: Vitals:   05/14/22 0808  BP: 122/80  Pulse: 78  Temp: 98.1 F (36.7 C)  TempSrc: Oral  SpO2: 100%  Weight: 181 lb 8 oz (82.3 kg)  Height: 5' 0.63" (1.54 m)    Body mass index is 34.71 kg/m.   Physical Exam Vitals reviewed.  Constitutional:      General: She is not in acute distress.    Appearance: Normal appearance. She is not ill-appearing, toxic-appearing or diaphoretic.  HENT:     Head: Normocephalic.     Right Ear: Tympanic membrane, ear canal and external ear normal. There is no impacted cerumen.     Left Ear: Tympanic membrane, ear canal and external ear normal. There is no impacted cerumen.     Nose: Nose normal.     Mouth/Throat:     Mouth: Mucous membranes are moist.     Pharynx: Oropharynx is clear. No oropharyngeal exudate or posterior oropharyngeal erythema.  Eyes:     General: No scleral icterus.       Right eye: No discharge.        Left eye: No discharge.     Conjunctiva/sclera: Conjunctivae normal.     Pupils: Pupils are equal, round, and reactive to light.  Neck:     Vascular: No carotid bruit.  Cardiovascular:     Rate and Rhythm: Normal rate and regular rhythm.     Pulses: Normal pulses.     Heart sounds: Normal heart sounds.  Pulmonary:     Effort: Pulmonary effort is normal. No respiratory distress.     Breath sounds: Normal breath sounds.  Abdominal:     General: Abdomen is flat. Bowel sounds are normal.     Palpations: Abdomen is soft.  Musculoskeletal:        General: Normal range of motion.     Cervical back: Normal range of motion.   Skin:    General: Skin is warm and dry.  Neurological:     General: No focal deficit present.     Mental Status: She is alert and oriented to person, place, and time. Mental status is at baseline.  Psychiatric:        Mood and  Affect: Mood normal.        Behavior: Behavior normal.        Thought Content: Thought content normal.        Judgment: Judgment normal.      Impression and Plan:  Encounter for preventive health examination  Morbid obesity (HCC) -     CBC with Differential/Platelet; Future -     Comprehensive metabolic panel; Future -     Vitamin B12; Future -     VITAMIN D 25 Hydroxy (Vit-D Deficiency, Fractures); Future  Dyslipidemia -     Lipid panel; Future  Hypothyroidism, unspecified type -     TSH; Future  Gastroesophageal reflux disease without esophagitis  Immunization due  Encounter for hepatitis C screening test for low risk patient -     Hepatitis C antibody; Future     -Recommend routine eye and dental care. -Healthy lifestyle discussed in detail. -Labs to be updated today. -Prostate cancer screening: N/A Health Maintenance  Topic Date Due   Hepatitis C Screening: USPSTF Recommendation to screen - Ages 44-79 yo.  Never done   Pap Smear  04/15/2020   Zoster (Shingles) Vaccine (1 of 2) Never done   Mammogram  05/11/2021   COVID-19 Vaccine (5 - 2023-24 season) 09/15/2021   Colon Cancer Screening  05/29/2022   Flu Shot  08/16/2022   DTaP/Tdap/Td vaccine (2 - Tdap) 02/19/2028   HIV Screening  Completed   HPV Vaccine  Aged Out   -First shingles vaccine today. -Will collect records from GYN.     Chaya Jan, MD [facility]

## 2022-05-14 NOTE — Addendum Note (Signed)
Addended by: Christy Sartorius on: 05/14/2022 08:49 AM   Modules accepted: Orders

## 2022-05-15 LAB — HEPATITIS C ANTIBODY: Hepatitis C Ab: NONREACTIVE

## 2022-05-18 LAB — CORTISOL, URINE, 24 HOUR: Cortisol (Ur), Free: 21.6 mcg/24 h (ref 4.0–50.0)

## 2022-05-19 LAB — CORTISOL, URINE, 24 HOUR
24 Hour urine volume (VMAHVA): 1600 mL
CREATININE, URINE: 1.08 g/(24.h) (ref 0.50–2.15)

## 2022-05-19 LAB — METANEPHRINES, URINE, 24 HOUR
METANEPHRINE: 113 mcg/24 h (ref 90–315)
METANEPHRINES, TOTAL: 379 mcg/24 h (ref 224–832)
NORMETANEPHRINE: 266 mcg/24 h (ref 122–676)
Total Volume: 1600 mL

## 2022-05-25 ENCOUNTER — Encounter: Payer: Self-pay | Admitting: Gastroenterology

## 2022-06-08 ENCOUNTER — Ambulatory Visit (AMBULATORY_SURGERY_CENTER): Payer: 59

## 2022-06-08 ENCOUNTER — Encounter: Payer: Self-pay | Admitting: Gastroenterology

## 2022-06-08 VITALS — Ht 60.0 in | Wt 175.0 lb

## 2022-06-08 DIAGNOSIS — K5909 Other constipation: Secondary | ICD-10-CM

## 2022-06-08 DIAGNOSIS — R194 Change in bowel habit: Secondary | ICD-10-CM

## 2022-06-08 DIAGNOSIS — Z86010 Personal history of colonic polyps: Secondary | ICD-10-CM

## 2022-06-08 MED ORDER — NA SULFATE-K SULFATE-MG SULF 17.5-3.13-1.6 GM/177ML PO SOLN: 1.0000 | Freq: Once | ORAL | 0 refills | Status: AC

## 2022-06-08 NOTE — Progress Notes (Signed)
No egg or soy allergy known to patient  No issues known to pt with past sedation with any surgeries or procedures Patient denies ever being told they had issues or difficulty with intubation  No FH of Malignant Hyperthermia Pt is not on diet pills Pt is not on  home 02  Pt is not on blood thinners  Pt reports constipation - pt taking OTC senna daily and miralax reports a BM about 2-3 times a week. Intermit hard stools vs runny. Instructions given for extra miralax prior to colonoscopy  No A fib or A flutter Have any cardiac testing pending--no  Pt is ambulatory   Patient's chart reviewed by Cathlyn Parsons CNRA prior to previsit and patient appropriate for the [facility].  Previsit completed and red dot placed by patient's name on their procedure day (on provider's schedule).     PV completed with patient. Prep instructions sent to MyChart and to home address. Goodrx coupon for [employer] provided.  Pt instructed to use Singlecare.com or GoodRx for a price reduction on prep

## 2022-06-18 ENCOUNTER — Other Ambulatory Visit: Payer: 59

## 2022-06-20 ENCOUNTER — Other Ambulatory Visit: Payer: 59

## 2022-06-21 ENCOUNTER — Ambulatory Visit
Admission: RE | Admit: 2022-06-21 | Discharge: 2022-06-21 | Disposition: A | Discharge status: Home / Self Care | Payer: 59 | Source: Ambulatory Visit | Attending: Internal Medicine | Admitting: Internal Medicine

## 2022-06-21 DIAGNOSIS — E039 Hypothyroidism, unspecified: Secondary | ICD-10-CM

## 2022-06-25 ENCOUNTER — Other Ambulatory Visit: Payer: 59

## 2022-06-26 ENCOUNTER — Encounter: Payer: Self-pay | Admitting: Gastroenterology

## 2022-06-26 ENCOUNTER — Ambulatory Visit (AMBULATORY_SURGERY_CENTER): Payer: 59 | Admitting: Gastroenterology

## 2022-06-26 VITALS — BP 134/71 | HR 62 | Temp 98.0°F | Resp 10 | Ht 60.0 in | Wt 175.0 lb

## 2022-06-26 DIAGNOSIS — Z09 Encounter for follow-up examination after completed treatment for conditions other than malignant neoplasm: Secondary | ICD-10-CM

## 2022-06-26 DIAGNOSIS — Z86010 Personal history of colonic polyps: Secondary | ICD-10-CM

## 2022-06-26 MED ORDER — SODIUM CHLORIDE 0.9 % IV SOLN
500.0000 mL | Freq: Once | INTRAVENOUS | Status: DC
Start: 2022-06-26 — End: 2022-06-26

## 2022-06-26 NOTE — Progress Notes (Signed)
History & Physical  Primary Care Physician:  Philip Aspen, Limmie Patricia, MD Primary Gastroenterologist: Claudette Head, MD  Impression / Plan:  Personal history of adenomatous and sessile serrated colon polyps for surveillance colonoscopy.  CHIEF COMPLAINT:  Personal history of colon polyps   HPI: Kelli Ferrell is a 52 y.o. female with a personal history of adenomatous and sessile serrated colon polyps for surveillance colonoscopy.    Past Medical History:  Diagnosis Date   Dyslipidemia 11/18/2014   Endometriosis    Fibroid, uterine    GERD (gastroesophageal reflux disease)    High cholesterol    History of colon polyps    History of kidney stones    History of positive PPD 2017   -neg skin test and neg xray -seen by health department and they offered but did not feel treatment was needed - she opted against treatment for POSSIBLE latent TB -in 2017   History of uterine fibroid    Hypothyroidism    followed by pcp   Mild obstructive sleep apnea    study in epic 06-29-2020 , per pt has not decided type of treatment recommended yet   Ovarian cyst    Pelvic pain in female    Seasonal allergies 11/22/2015   SOB (shortness of breath)    Vitamin D deficiency     Past Surgical History:  Procedure Laterality Date   ABLATION ON ENDOMETRIOSIS N/A 08/26/2020   Procedure: ABLATION ON ENDOMETRIOSIS;  Surgeon: Edwinna Areola, DO;  Location: [facility];  Service: Gynecology;  Laterality: N/A;   COLONOSCOPY  04/2019   DILATION AND CURETTAGE OF UTERUS  2021   LAPAROSCOPIC LYSIS OF ADHESIONS N/A 08/26/2020   Procedure: LAPAROSCOPIC LYSIS OF ADHESIONS;  Surgeon: Edwinna Areola, DO;  Location: [facility];  Service: Gynecology;  Laterality: N/A;   LAPAROSCOPIC OVARIAN CYSTECTOMY Bilateral 08/26/2020   Procedure: LAPAROSCOPIC OVARIAN CYSTECTOMY;  Surgeon: Edwinna Areola, DO;  Location: [facility];  Service: Gynecology;   Laterality: Bilateral;   LAPAROSCOPY N/A 08/26/2020   Procedure: LAPAROSCOPY DIAGNOSTIC;  Surgeon: Edwinna Areola, DO;  Location: [facility];  Service: Gynecology;  Laterality: N/A;   NASAL SINUS SURGERY  2007   TUBAL LIGATION Bilateral 2001   UTERINE FIBROID EMBOLIZATION  2014    Prior to Admission medications   Medication Sig Start Date End Date Taking? Authorizing Provider  CALCIUM-MAGNESIUM-ZINC PO Take 1 tablet by mouth daily.   Yes [provider]  cetirizine (ZYRTEC) 10 MG tablet Take 10 mg by mouth daily.   Yes [provider]  Cholecalciferol (VITAMIN D3) 125 MCG (5000 UT) CAPS Take 1 capsule (5,000 Units total) by mouth daily. 08/24/21  Yes Langston Reusing, MD  Coenzyme Q10 (CO Q-10) 100 MG CAPS Take 100 mg by mouth.   Yes [provider]  Cyanocobalamin (B-12) 2500 MCG TABS Take 2,500 mcg by mouth.   Yes [provider]  MILK THISTLE PO Take 1 tablet by mouth daily.   Yes [provider]  Potassium 99 MG TABS Take 1 tablet by mouth daily. OTC   Yes [provider]  Probiotic Product (PROBIOTIC PO) Take 1 tablet by mouth daily.   Yes [provider]  Turmeric Curcumin 500 MG CAPS Take 500 mg by mouth daily.   Yes [provider]  vitamin E 180 MG (400 UNITS) capsule Take 400 Units by mouth daily.   Yes [provider]  Chromium Picolinate 800  MCG TABS Take 800 mcg by mouth daily.    [provider]  levothyroxine (SYNTHROID) 100 MCG tablet TAKE 1 TABLET DAILY BEFORE BREAKFAST Patient not taking: Reported on 06/08/2022 07/17/21   Philip Aspen, Limmie Patricia, MD  Melatonin 10 MG TABS Take 1 tablet by mouth at bedtime as needed. Patient not taking: Reported on 06/08/2022    [provider]    Current Outpatient Medications  Medication Sig Dispense Refill   CALCIUM-MAGNESIUM-ZINC PO Take 1 tablet by mouth daily.     cetirizine (ZYRTEC) 10 MG tablet Take 10 mg by  mouth daily.     Cholecalciferol (VITAMIN D3) 125 MCG (5000 UT) CAPS Take 1 capsule (5,000 Units total) by mouth daily. 30 capsule    Coenzyme Q10 (CO Q-10) 100 MG CAPS Take 100 mg by mouth.     Cyanocobalamin (B-12) 2500 MCG TABS Take 2,500 mcg by mouth.     MILK THISTLE PO Take 1 tablet by mouth daily.     Potassium 99 MG TABS Take 1 tablet by mouth daily. OTC     Probiotic Product (PROBIOTIC PO) Take 1 tablet by mouth daily.     Turmeric Curcumin 500 MG CAPS Take 500 mg by mouth daily.     vitamin E 180 MG (400 UNITS) capsule Take 400 Units by mouth daily.     Chromium Picolinate 800 MCG TABS Take 800 mcg by mouth daily.     levothyroxine (SYNTHROID) 100 MCG tablet TAKE 1 TABLET DAILY BEFORE BREAKFAST (Patient not taking: Reported on 06/08/2022) 90 tablet 1   Melatonin 10 MG TABS Take 1 tablet by mouth at bedtime as needed. (Patient not taking: Reported on 06/08/2022)     Current Facility-Administered Medications  Medication Dose Route Frequency Provider Last Rate Last Admin   0.9 %  sodium chloride infusion  500 mL Intravenous Once Meryl Dare, MD        Allergies as of 06/26/2022 - Review Complete 06/26/2022  Allergen Reaction Noted   Sulfamethoxazole-trimethoprim Other (See Comments) 06/24/2015    Family History  Problem Relation Age of Onset   Hyperlipidemia Mother    Diabetes Mother    Stroke Mother    Hypertension Mother    Thyroid disease Mother    Sleep apnea Mother    Diabetes Father    Thyroid cancer Sister    Diabetes Brother    Colon cancer Neg Hx    Colon polyps Neg Hx    Esophageal cancer Neg Hx    Rectal cancer Neg Hx    Stomach cancer Neg Hx     Social History   Socioeconomic History   Marital status: Married    Spouse name: Not on file   Number of children: 4   Years of education: some college   Highest education level: Some college, no degree  Occupational History   Occupation: Scientist, water quality  Tobacco Use   Smoking status: Never    Smokeless tobacco: Never  Vaping Use   Vaping Use: Never used  Substance and Sexual Activity   Alcohol use: Yes    Comment: occasional   Drug use: Never   Sexual activity: Yes    Birth control/protection: Surgical    Comment: tubal  Other Topics Concern   Not on file  Social History Narrative   Work or School: [employer]      Home Situation: lives with husband and two children      Spiritual Beliefs: Curator  Lifestyle: regular exercise and healthy diet      Right-handed.      Two cups caffeine per day.         Social Determinants of Health   Financial Resource Strain: Low Risk  (05/06/2021)   Overall Financial Resource Strain (CARDIA)    Difficulty of Paying Living Expenses: Not hard at all  Food Insecurity: No Food Insecurity (05/06/2021)   Hunger Vital Sign    Worried About Running Out of Food in the Last Year: Never true    Ran Out of Food in the Last Year: Never true  Transportation Needs: No Transportation Needs (05/06/2021)   PRAPARE - Administrator, Civil Service (Medical): No    Lack of Transportation (Non-Medical): No  Physical Activity: Insufficiently Active (05/06/2021)   Exercise Vital Sign    Days of Exercise per Week: 6 days    Minutes of Exercise per Session: 20 min  Stress: Stress Concern Present (05/06/2021)   [employer] of Occupational Health - Occupational Stress Questionnaire    Feeling of Stress : To some extent  Social Connections: Socially Integrated (05/06/2021)   Social Connection and Isolation Panel [NHANES]    Frequency of Communication with Friends and Family: More than three times a week    Frequency of Social Gatherings with Friends and Family: Twice a week    Attends Religious Services: More than 4 times per year    Active Member of [employer] or Organizations: Yes    Attends Engineer, structural: More than 4 times per year    Marital Status: Married  Catering manager Violence: Not on  file    Review of Systems:  All systems reviewed were negative except where noted in HPI.   Physical Exam:  General:  Alert, well-developed, in NAD Head:  Normocephalic and atraumatic. Eyes:  Sclera clear, no icterus.   Conjunctiva pink. Ears:  Normal auditory acuity. Mouth:  No deformity or lesions.  Neck:  Supple; no masses. Lungs:  Clear throughout to auscultation.   No wheezes, crackles, or rhonchi.  Heart:  Regular rate and rhythm; no murmurs. Abdomen:  Soft, nondistended, nontender. No masses, hepatomegaly. No palpable masses.  Normal bowel sounds.    Rectal:  Deferred   Msk:  Symmetrical without gross deformities. Extremities:  Without edema. Neurologic:  Alert and  oriented x 4; grossly normal neurologically. Skin:  Intact without significant lesions or rashes. Psych:  Alert and cooperative. Normal mood and affect.   Venita Lick. Russella Dar  06/26/2022, 2:18 PM See Loretha Stapler, [facility], to contact our on call provider

## 2022-06-26 NOTE — Progress Notes (Signed)
Pt's states no medical or surgical changes since previsit or office visit. 

## 2022-06-26 NOTE — Patient Instructions (Signed)
Please read handouts provided. Continue present medications. Repeat colonoscopy in 7 years for screening. Resume previous diet.   YOU HAD AN ENDOSCOPIC PROCEDURE TODAY AT THE [facility]:   Refer to the procedure report that was given to you for any specific questions about what was found during the examination.  If the procedure report does not answer your questions, please call your gastroenterologist to clarify.  If you requested that your care partner not be given the details of your procedure findings, then the procedure report has been included in a sealed envelope for you to review at your convenience later.  YOU SHOULD EXPECT: Some feelings of bloating in the abdomen. Passage of more gas than usual.  Walking can help get rid of the air that was put into your GI tract during the procedure and reduce the bloating. If you had a lower endoscopy (such as a colonoscopy or flexible sigmoidoscopy) you may notice spotting of blood in your stool or on the toilet paper. If you underwent a bowel prep for your procedure, you may not have a normal bowel movement for a few days.  Please Note:  You might notice some irritation and congestion in your nose or some drainage.  This is from the oxygen used during your procedure.  There is no need for concern and it should clear up in a day or so.  SYMPTOMS TO REPORT IMMEDIATELY:  Following lower endoscopy (colonoscopy or flexible sigmoidoscopy):  Excessive amounts of blood in the stool  Significant tenderness or worsening of abdominal pains  Swelling of the abdomen that is new, acute  Fever of 100F or higher  For urgent or emergent issues, a gastroenterologist can be reached at any hour by calling [phone]. Do not use MyChart messaging for urgent concerns.    DIET:  We do recommend a small meal at first, but then you may proceed to your regular diet.  Drink plenty of fluids but you should avoid alcoholic beverages for 24  hours.  ACTIVITY:  You should plan to take it easy for the rest of today and you should NOT DRIVE or use heavy machinery until tomorrow (because of the sedation medicines used during the test).    FOLLOW UP: Our staff will call the number listed on your records the next business day following your procedure.  We will call around 7:15- 8:00 am to check on you and address any questions or concerns that you may have regarding the information given to you following your procedure. If we do not reach you, we will leave a message.     If any biopsies were taken you will be contacted by phone or by letter within the next 1-3 weeks.  Please call us at [phone] if you have not heard about the biopsies in 3 weeks.    SIGNATURES/CONFIDENTIALITY: You and/or your care partner have signed paperwork which will be entered into your electronic medical record.  These signatures attest to the fact that that the information above on your After Visit Summary has been reviewed and is understood.  Full responsibility of the confidentiality of this discharge information lies with you and/or your care-partner.

## 2022-06-26 NOTE — Op Note (Signed)
[facility] Patient Name: Kelli Ferrell Procedure Date: 06/26/2022 2:21 PM MRN: [id number] Endoscopist: Meryl Dare , MD, [phone] Age: 52 Referring MD:  Date of Birth: [birth year] Gender: Female Account #: [id number] Procedure:                Colonoscopy Indications:              Surveillance: Personal history of adenomatous and                            sessile serrated polyps on last colonoscopy > 3                            years ago Medicines:                Monitored Anesthesia Care Procedure:                Pre-Anesthesia Assessment:                           - Prior to the procedure, a History and Physical                            was performed, and patient medications and                            allergies were reviewed. The patient's tolerance of                            previous anesthesia was also reviewed. The risks                            and benefits of the procedure and the sedation                            options and risks were discussed with the patient.                            All questions were answered, and informed consent                            was obtained. Prior Anticoagulants: The patient has                            taken no anticoagulant or antiplatelet agents. ASA                            Grade Assessment: II - A patient with mild systemic                            disease. After reviewing the risks and benefits,                            the patient was deemed in satisfactory condition to  undergo the procedure.                           After obtaining informed consent, the colonoscope                            was passed under direct vision. Throughout the                            procedure, the patient's blood pressure, pulse, and                            oxygen saturations were monitored continuously. The                            Olympus PCF-H190DL [id number]) Colonoscope  was                            introduced through the anus and advanced to the the                            cecum, identified by appendiceal orifice and                            ileocecal valve. The ileocecal valve, appendiceal                            orifice, and rectum were photographed. The quality                            of the bowel preparation was adequate after                            extensive lavage, suction. The colonoscopy was                            somewhat difficult due to significant looping and a                            tortuous colon. The patient tolerated the procedure                            well. Scope In: 2:24:55 PM Scope Out: 2:47:13 PM Scope Withdrawal Time: 0 hours 12 minutes 15 seconds  Total Procedure Duration: 0 hours 22 minutes 18 seconds  Findings:                 The perianal and digital rectal examinations were                            normal.                           A diffuse area of mild melanosis was found in the  entire colon.                           The exam was otherwise without abnormality on                            direct and retroflexion views. Complications:            No immediate complications. Estimated blood loss:                            None. Estimated Blood Loss:     Estimated blood loss: none. Impression:               - Melanosis in the colon.                           - The examination was otherwise normal on direct                            and retroflexion views.                           - No specimens collected. Recommendation:           - Repeat colonoscopy in 7 years for surveillance.                           - Patient has a contact number available for                            emergencies. The signs and symptoms of potential                            delayed complications were discussed with the                            patient. Return to normal activities  tomorrow.                            Written discharge instructions were provided to the                            patient.                           - Resume previous diet.                           - Continue present medications. Meryl Dare, MD 06/26/2022 2:57:56 PM This report has been signed electronically.

## 2022-06-26 NOTE — Progress Notes (Signed)
Report to PACU, RN, vss, BBS= Clear.  

## 2022-06-27 ENCOUNTER — Telehealth: Payer: Self-pay

## 2022-06-27 NOTE — Telephone Encounter (Signed)
  Follow up Call-     06/26/2022    1:46 PM  Call back number  Post procedure Call Back phone  # [phone]  Permission to leave phone message Yes     Patient questions:  Do you have a fever, pain , or abdominal swelling? No. Pain Score  0 *  Have you tolerated food without any problems? Yes.    Have you been able to return to your normal activities? Yes.    Do you have any questions about your discharge instructions: Diet   No. Medications  No. Follow up visit  No.  Do you have questions or concerns about your Care? No.  Actions: * If pain score is 4 or above: No action needed, pain <4.

## 2022-07-30 ENCOUNTER — Ambulatory Visit (INDEPENDENT_AMBULATORY_CARE_PROVIDER_SITE_OTHER): Payer: 59

## 2022-07-30 DIAGNOSIS — Z23 Encounter for immunization: Secondary | ICD-10-CM | POA: Diagnosis not present

## 2022-11-12 ENCOUNTER — Encounter: Payer: Self-pay | Admitting: Internal Medicine

## 2022-11-12 ENCOUNTER — Ambulatory Visit: Payer: 59 | Admitting: Internal Medicine

## 2022-11-12 VITALS — BP 124/86 | HR 102 | Ht 60.0 in | Wt 190.0 lb

## 2022-11-12 DIAGNOSIS — E039 Hypothyroidism, unspecified: Secondary | ICD-10-CM | POA: Diagnosis not present

## 2022-11-12 DIAGNOSIS — D1779 Benign lipomatous neoplasm of other sites: Secondary | ICD-10-CM

## 2022-11-12 LAB — T4, FREE: Free T4: 0.71 ng/dL (ref 0.60–1.60)

## 2022-11-12 LAB — VITAMIN D 25 HYDROXY (VIT D DEFICIENCY, FRACTURES): VITD: 36.54 ng/mL (ref 30.00–100.00)

## 2022-11-12 LAB — T3, FREE: T3, Free: 3.6 pg/mL (ref 2.3–4.2)

## 2022-11-12 LAB — TSH: TSH: 1.42 u[IU]/mL (ref 0.35–5.50)

## 2022-11-12 NOTE — Progress Notes (Unsigned)
Name: Kelli Ferrell  MRN/ DOB: [id number], 1970-06-16    Age/ Sex: 52 y.o., female    PCP: Philip Aspen, Limmie Patricia, MD   Reason for Endocrinology Evaluation: Hypothyroidism     Date of Initial Endocrinology Evaluation: 05/07/2022    HPI: Kelli Ferrell is a 52 y.o. female with a past medical history of Dyslipidemia, OSA, hypothyroidism. The patient presented for initial endocrinology clinic visit on 05/07/2022  for consultative assistance with her Hypothyroidism.   Patient has been referred here for further management of hypothyroid which she was diagnosed in 1994 right after the birth of her daughter   She was having difficulty losing weight, she saw the healthy weight and wellness clinic last year, but was not comfortable with the recommendations as she was asked to increase carbohydrate intake  Over the past year her weight has been stable, and she continues to follow a healthy lifestyle She started a research study 10/2021 through [facility] in [place], the study is regarding desiccated thyroid hormone (progressive medicine) , this is a blinded study and will complete next month.   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  Right gland is normal     Sister with thyroid cancer  Mother with thyroid disease initially hypothyroidism followed by hyperthyroidism  + FH of T1DM    Patient with normal 24-hour urine metanephrines as well as cortisol, renin and aldosterone  04/2022  SUBJECTIVE:    Today (11/12/22): Kelli Ferrell is here for follow-up on Hashimoto's thyroiditis as well as left adrenal adenoma   Pt has been noted with weight gain  She has left gum infection and cervical lymphadenopathy  No biotin  Denies palpitation Has chronic constipation which is  improving but no diarrhea  Denies tremors  Denies panic attacks or anxiety  Denies LE edema     HISTORY:  Past Medical  History:  Past Medical History:  Diagnosis Date   Dyslipidemia 11/18/2014   Endometriosis    Fibroid, uterine    GERD (gastroesophageal reflux disease)    High cholesterol    History of colon polyps    History of kidney stones    History of positive PPD 2017   -neg skin test and neg xray -seen by health department and they offered but did not feel treatment was needed - she opted against treatment for POSSIBLE latent TB -in 2017   History of uterine fibroid    Hypothyroidism    followed by pcp   Mild obstructive sleep apnea    study in epic 06-29-2020 , per pt has not decided type of treatment recommended yet   Ovarian cyst    Pelvic pain in female    Seasonal allergies 11/22/2015   SOB (shortness of breath)    Vitamin D deficiency    Past Surgical History:  Past Surgical History:  Procedure Laterality Date   ABLATION ON ENDOMETRIOSIS N/A 08/26/2020   Procedure: ABLATION ON ENDOMETRIOSIS;  Surgeon: Edwinna Areola, DO;  Location: [facility];  Service: Gynecology;  Laterality: N/A;   COLONOSCOPY  04/2019   DILATION AND CURETTAGE OF UTERUS  2021   LAPAROSCOPIC LYSIS OF ADHESIONS N/A 08/26/2020   Procedure: LAPAROSCOPIC LYSIS OF ADHESIONS;  Surgeon: Edwinna Areola, DO;  Location: [facility];  Service: Gynecology;  Laterality: N/A;   LAPAROSCOPIC OVARIAN CYSTECTOMY Bilateral 08/26/2020   Procedure: LAPAROSCOPIC OVARIAN CYSTECTOMY;  Surgeon: Edwinna Areola, DO;  Location: [facility];  Service: Gynecology;  Laterality: Bilateral;   LAPAROSCOPY N/A 08/26/2020   Procedure: LAPAROSCOPY DIAGNOSTIC;  Surgeon: Edwinna Areola, DO;  Location:  SURGERY CENTER;  Service: Gynecology;  Laterality: N/A;   NASAL SINUS SURGERY  2007   TUBAL LIGATION Bilateral 2001   UTERINE FIBROID EMBOLIZATION  2014    Social History:  reports that she has never smoked. She has never used smokeless tobacco. She reports current  alcohol use. She reports that she does not use drugs. Family History: family history includes Diabetes in her brother, father, and mother; Hyperlipidemia in her mother; Hypertension in her mother; Sleep apnea in her mother; Stroke in her mother; Thyroid cancer in her sister; Thyroid disease in her mother.   HOME MEDICATIONS: Allergies as of 11/12/2022       Reactions   Sulfamethoxazole-trimethoprim Other (See Comments)   Other Reaction(s): GI Intolerance        Medication List        Accurate as of November 12, 2022  1:07 PM. If you have any questions, ask your nurse or doctor.          B-12 2500 MCG Tabs Take 2,500 mcg by mouth.   CALCIUM-MAGNESIUM-ZINC PO Take 1 tablet by mouth daily.   cetirizine 10 MG tablet Commonly known as: ZYRTEC Take 10 mg by mouth daily.   Chromium Picolinate 800 MCG Tabs Take 800 mcg by mouth daily.   Co Q-10 100 MG Caps Take 100 mg by mouth.   levothyroxine 100 MCG tablet Commonly known as: SYNTHROID TAKE 1 TABLET DAILY BEFORE BREAKFAST   lisdexamfetamine 20 MG capsule Commonly known as: VYVANSE Take 20 mg by mouth daily.   Vyvanse 40 MG capsule Generic drug: lisdexamfetamine Take 40 mg by mouth daily.   Melatonin 10 MG Tabs Take 1 tablet by mouth at bedtime as needed.   MILK THISTLE PO Take 1 tablet by mouth daily.   mirabegron ER 50 MG Tb24 tablet Commonly known as: MYRBETRIQ Take 50 mg by mouth daily.   NON FORMULARY Desiccated thyroid extract  2 pills daily   Potassium 99 MG Tabs Take 1 tablet by mouth daily. OTC   PROBIOTIC PO Take 1 tablet by mouth daily.   Turmeric Curcumin 500 MG Caps Take 500 mg by mouth daily.   Vitamin D3 125 MCG (5000 UT) Caps Take 1 capsule (5,000 Units total) by mouth daily.   vitamin E 180 MG (400 UNITS) capsule Take 400 Units by mouth daily.          REVIEW OF SYSTEMS: A comprehensive ROS was conducted with the patient and is negative except as per HPI       OBJECTIVE:  VS: BP 124/86 (BP Location: Left Arm, Patient Position: Sitting, Cuff Size: Large)   Pulse (!) 102   Ht 5' (1.524 m)   Wt 190 lb (86.2 kg)   SpO2 96%   BMI 37.11 kg/m    Wt Readings from Last 3 Encounters:  11/12/22 190 lb (86.2 kg)  06/26/22 175 lb (79.4 kg)  06/08/22 175 lb (79.4 kg)     EXAM: General: Pt appears well and is in NAD  Neck: General: Supple without adenopathy. Thyroid: Thyroid size normal.  No goiter or nodules appreciated  Lungs: Clear with good BS bilat   Heart: Auscultation: RRR.  Abdomen:  soft, nontender  Extremities:  BL LE: No pretibial edema   Mental Status: Judgment, insight: Intact Orientation: Oriented to time, place,  and person Mood and affect: No depression, anxiety, or agitation     DATA REVIEWED:  Latest Reference Range & Units 11/12/22 13:39  TSH 0.35 - 5.50 uIU/mL 1.42  Triiodothyronine,Free,Serum 2.3 - 4.2 pg/mL 3.6  T4,Free(Direct) 0.60 - 1.60 ng/dL 5.78     Latest Reference Range & Units 11/12/22 13:39  VITD 30.00 - 100.00 ng/mL 36.54     Latest Reference Range & Units 05/14/22 09:19  24 Hour urine volume (VMAHVA) mL 1,600  Cortisol (Ur), Free 4.0 - 50.0 mcg/24 h 21.6  Metanephrine 90 - 315 mcg/24 h 113  Metanephrines, Total 224 - 832 mcg/24 h 379  Normetanephrine 122 - 676 mcg/24 h 266    Latest Reference Range & Units 05/07/22 13:39  ALDOSTERONE 0.0 - 30.0 ng/dL 5.1  ALDOS/RENIN RATIO 0.0 - 30.0  8.3      Latest Reference Range & Units 05/07/22 13:39  Thyroperoxidase Ab SerPl-aCnc 0 - 34 IU/mL 62 (H)    CT abdomen 08/19/2022  Adrenals/Urinary Tract: 19 mm macroscopic fat containing benign left adrenal myelolipoma noted. The adrenal glands are otherwise unremarkable. The kidneys are normal. The bladder is unremarkable.   Thyroid ultrasound 06/21/2022  FINDINGS: Parenchymal Echotexture: Moderately heterogenous   Isthmus: 0.3 cm   Right lobe: 3.6 cm x 1.4 cm x 1.3 cm   Left lobe: 3.7 cm x  1.3 cm x 1.3 cm   _________________________________________________________   Estimated total number of nodules >/= 1 cm: 0   Number of spongiform nodules >/=  2 cm not described below (TR1): 0   Number of mixed cystic and solid nodules >/= 1.5 cm not described below (TR2): 0   _________________________________________________________   No discrete nodules are seen within the thyroid gland.   No adenopathy   IMPRESSION: Heterogeneous thyroid suggesting medical thyroid disease.     ASSESSMENT/PLAN/RECOMMENDATIONS:   Hashimoto's Thyroiditis :  -Patient has been noted with elevated anti-TPO antibody at 62 IU/mL 04/2022 -Patient is clinically euthyroid -Thyroid ultrasound did not show any discrete nodules 08/2022 -Repeat TFTs remain within normal range, no indication for LT-for replacement therapy at this time     2.  Left adrenal myelolipoma  -The risk of hormonal excretion is very minute -Aldo/renin and 24-hour urinary cortisol, metanephrines were normal 04/2022    Follow-up in 1  yr    I spent 25 minutes preparing to see the patient by review of recent labs, imaging and procedures, obtaining and reviewing separately obtained history, communicating with the patient, ordering medications, tests or procedures, and documenting clinical information in the EHR including the differential Dx, treatment, and any further evaluation and other management   Signed electronically by: Lyndle Herrlich, MD  [facility] Endocrinology  [facility] [address] [place] [place], [place] [address] Phone: [phone] FAX: [phone]   CC: Philip Aspen, Limmie Patricia, MD [address] [place] [address] Phone: [phone] Fax: [phone]   Return to Endocrinology clinic as below: Future Appointments  Date Time Provider Department Center  05/15/2023  7:00 AM Philip Aspen, Limmie Patricia, MD [id number]

## 2023-05-15 ENCOUNTER — Ambulatory Visit (INDEPENDENT_AMBULATORY_CARE_PROVIDER_SITE_OTHER): Payer: 59 | Admitting: Internal Medicine

## 2023-05-15 ENCOUNTER — Encounter: Payer: Self-pay | Admitting: Internal Medicine

## 2023-05-15 VITALS — BP 130/84 | HR 75 | Temp 97.8°F | Ht 61.0 in | Wt 196.2 lb

## 2023-05-15 DIAGNOSIS — E038 Other specified hypothyroidism: Secondary | ICD-10-CM | POA: Diagnosis not present

## 2023-05-15 DIAGNOSIS — E039 Hypothyroidism, unspecified: Secondary | ICD-10-CM | POA: Diagnosis not present

## 2023-05-15 DIAGNOSIS — Z0000 Encounter for general adult medical examination without abnormal findings: Secondary | ICD-10-CM | POA: Diagnosis not present

## 2023-05-15 DIAGNOSIS — Z1231 Encounter for screening mammogram for malignant neoplasm of breast: Secondary | ICD-10-CM

## 2023-05-15 DIAGNOSIS — E559 Vitamin D deficiency, unspecified: Secondary | ICD-10-CM | POA: Diagnosis not present

## 2023-05-15 DIAGNOSIS — E782 Mixed hyperlipidemia: Secondary | ICD-10-CM | POA: Diagnosis not present

## 2023-05-15 DIAGNOSIS — E785 Hyperlipidemia, unspecified: Secondary | ICD-10-CM

## 2023-05-15 LAB — COMPREHENSIVE METABOLIC PANEL WITH GFR
ALT: 17 U/L (ref 0–35)
AST: 17 U/L (ref 0–37)
Albumin: 3.8 g/dL (ref 3.5–5.2)
Alkaline Phosphatase: 49 U/L (ref 39–117)
BUN: 17 mg/dL (ref 6–23)
CO2: 26 meq/L (ref 19–32)
Calcium: 9 mg/dL (ref 8.4–10.5)
Chloride: 103 meq/L (ref 96–112)
Creatinine, Ser: 0.83 mg/dL (ref 0.40–1.20)
GFR: 81.27 mL/min (ref >60.00)
Glucose, Bld: 86 mg/dL (ref 70–99)
Potassium: 4.1 meq/L (ref 3.5–5.1)
Sodium: 136 meq/L (ref 135–145)
Total Bilirubin: 0.3 mg/dL (ref 0.2–1.2)
Total Protein: 6.6 g/dL (ref 6.0–8.3)

## 2023-05-15 LAB — CBC WITH DIFFERENTIAL/PLATELET
Basophils Absolute: 0 10*3/uL (ref 0.0–0.1)
Basophils Relative: 0.4 % (ref 0.0–3.0)
Eosinophils Absolute: 0.1 10*3/uL (ref 0.0–0.7)
Eosinophils Relative: 1.9 % (ref 0.0–5.0)
HCT: 42.1 % (ref 36.0–46.0)
Hemoglobin: 14.3 g/dL (ref 12.0–15.0)
Lymphocytes Relative: 33.9 % (ref 12.0–46.0)
Lymphs Abs: 1.5 10*3/uL (ref 0.7–4.0)
MCHC: 33.9 g/dL (ref 30.0–36.0)
MCV: 90.2 fl (ref 78.0–100.0)
Monocytes Absolute: 0.4 10*3/uL (ref 0.1–1.0)
Monocytes Relative: 9.7 % (ref 3.0–12.0)
Neutro Abs: 2.4 10*3/uL (ref 1.4–7.7)
Neutrophils Relative %: 54.1 % (ref 43.0–77.0)
Platelets: 303 10*3/uL (ref 150.0–400.0)
RBC: 4.67 Mil/uL (ref 3.87–5.11)
RDW: 13.5 % (ref 11.5–15.5)
WBC: 4.3 10*3/uL (ref 4.0–10.5)

## 2023-05-15 LAB — TSH: TSH: 2.45 u[IU]/mL (ref 0.35–5.50)

## 2023-05-15 LAB — VITAMIN B12: Vitamin B-12: 760 pg/mL (ref 211–911)

## 2023-05-15 LAB — VITAMIN D 25 HYDROXY (VIT D DEFICIENCY, FRACTURES): VITD: 33.11 ng/mL (ref 30.00–100.00)

## 2023-05-15 LAB — LIPID PANEL
Cholesterol: 198 mg/dL (ref 0–200)
HDL: 46.6 mg/dL (ref >39.00)
LDL Cholesterol: 131 mg/dL — ABNORMAL HIGH (ref 0–99)
NonHDL: 151.3
Total CHOL/HDL Ratio: 4
Triglycerides: 100 mg/dL (ref 0.0–149.0)
VLDL: 20 mg/dL (ref 0.0–40.0)

## 2023-05-15 NOTE — Progress Notes (Signed)
 Established Patient Office Visit     CC/Reason for Visit: Annual preventive exam and discuss chronic conditions  HPI: Kelli Ferrell is a 53 y.o. female who is coming in today for the above mentioned reasons. Past Medical History is significant for: GERD, hypothyroidism, morbid obesity.  She is feeling well.  She is concerned about her weight gain.  She has a GYN whom she follows with routinely.  Has routine eye and dental care.   Past Medical/Surgical History: Past Medical History:  Diagnosis Date   Dyslipidemia 11/18/2014   Endometriosis    Fibroid, uterine    GERD (gastroesophageal reflux disease)    High cholesterol    History of colon polyps    History of kidney stones    History of positive PPD 2017   -neg skin test and neg xray -seen by health department and they offered but did not feel treatment was needed - she opted against treatment for POSSIBLE latent TB -in 2017   History of uterine fibroid    Hypothyroidism    followed by pcp   Mild obstructive sleep apnea    study in epic 06-29-2020 , per pt has not decided type of treatment recommended yet   Ovarian cyst    Pelvic pain in female    Seasonal allergies 11/22/2015   SOB (shortness of breath)    Vitamin D  deficiency     Past Surgical History:  Procedure Laterality Date   ABLATION ON ENDOMETRIOSIS N/A 08/26/2020   Procedure: ABLATION ON ENDOMETRIOSIS;  Surgeon: Loa Riling, DO;  Location: [facility];  Service: Gynecology;  Laterality: N/A;   COLONOSCOPY  04/2019   DILATION AND CURETTAGE OF UTERUS  2021   LAPAROSCOPIC LYSIS OF ADHESIONS N/A 08/26/2020   Procedure: LAPAROSCOPIC LYSIS OF ADHESIONS;  Surgeon: Loa Riling, DO;  Location: [facility];  Service: Gynecology;  Laterality: N/A;   LAPAROSCOPIC OVARIAN CYSTECTOMY Bilateral 08/26/2020   Procedure: LAPAROSCOPIC OVARIAN CYSTECTOMY;  Surgeon: Loa Riling, DO;  Location: [facility];  Service: Gynecology;  Laterality: Bilateral;   LAPAROSCOPY N/A 08/26/2020   Procedure: LAPAROSCOPY DIAGNOSTIC;  Surgeon: Loa Riling, DO;  Location:  SUR[facility]ERY CENTER;  Service: Gynecology;  Laterality: N/A;   NASAL SINUS SURGERY  2007   TUBAL LIGATION Bilateral 2001   UTERINE FIBROID EMBOLIZATION  2014    Social History:  reports that she has never smoked. She has never used smokeless tobacco. She reports current alcohol use. She reports that she does not use drugs.  Allergies: Allergies  Allergen Reactions   Sulfamethoxazole-Trimethoprim Other (See Comments)    Other Reaction(s): GI Intolerance    Family History:  Family History  Problem Relation Age of Onset   Hyperlipidemia Mother    Diabetes Mother    Stroke Mother    Hypertension Mother    Thyroid  disease Mother    Sleep apnea Mother    Diabetes Father    Thyroid  cancer Sister    Diabetes Brother    Colon cancer Neg Hx    Colon polyps Neg Hx    Esophageal cancer Neg Hx    Rectal cancer Neg Hx    Stomach cancer Neg Hx      Current Outpatient Medications:    CALCIUM-MAGNESIUM-ZINC PO, Take 1 tablet by mouth daily., Disp: , Rfl:    cetirizine (ZYRTEC) 10 MG tablet, Take 10 mg by mouth daily., Disp: , Rfl:    Cholecalciferol (VITAMIN D3) 125  MCG (5000 UT) CAPS, Take 1 capsule (5,000 Units total) by mouth daily., Disp: 30 capsule, Rfl:    Chromium Picolinate 800 MCG TABS, Take 800 mcg by mouth daily., Disp: , Rfl:    Coenzyme Q10 (CO Q-10) 100 MG CAPS, Take 100 mg by mouth., Disp: , Rfl:    Cyanocobalamin  (B-12) 2500 MCG TABS, Take 2,500 mcg by mouth., Disp: , Rfl:    levothyroxine  (SYNTHROID ) 100 MCG tablet, TAKE 1 TABLET DAILY BEFORE BREAKFAST, Disp: 90 tablet, Rfl: 1   lisdexamfetamine (VYVANSE) 20 MG capsule, Take 20 mg by mouth daily., Disp: , Rfl:    Melatonin 10 MG TABS, Take 1 tablet by mouth at bedtime as needed., Disp: , Rfl:    MILK THISTLE PO, Take 1 tablet by mouth daily., Disp:  , Rfl:    mirabegron ER (MYRBETRIQ) 50 MG TB24 tablet, Take 50 mg by mouth daily., Disp: , Rfl:    NON FORMULARY, Desiccated thyroid  extract  2 pills daily, Disp: , Rfl:    Potassium 99 MG TABS, Take 1 tablet by mouth daily. OTC, Disp: , Rfl:    Probiotic Product (PROBIOTIC PO), Take 1 tablet by mouth daily., Disp: , Rfl:    Turmeric Curcumin 500 MG CAPS, Take 500 mg by mouth daily., Disp: , Rfl:    vitamin E 180 MG (400 UNITS) capsule, Take 400 Units by mouth daily., Disp: , Rfl:    VYVANSE 40 MG capsule, Take 40 mg by mouth daily., Disp: , Rfl:   Review of Systems:  Negative unless indicated in HPI.   Physical Exam: Vitals:   05/15/23 0713  BP: 130/84  Pulse: 75  Temp: 97.8 F (36.6 C)  TempSrc: Oral  SpO2: 99%  Weight: 196 lb 3.2 oz (89 kg)  Height: 5\' 1"  (1.549 m)    Body mass index is 37.07 kg/m.   Physical Exam Vitals reviewed.  Constitutional:      General: She is not in acute distress.    Appearance: Normal appearance. She is obese. She is not ill-appearing, toxic-appearing or diaphoretic.  HENT:     Head: Normocephalic.     Right Ear: Tympanic membrane, ear canal and external ear normal. There is no impacted cerumen.     Left Ear: Tympanic membrane, ear canal and external ear normal. There is no impacted cerumen.     Nose: Nose normal.     Mouth/Throat:     Mouth: Mucous membranes are moist.     Pharynx: Oropharynx is clear. No oropharyngeal exudate or posterior oropharyngeal erythema.  Eyes:     General: No scleral icterus.       Right eye: No discharge.        Left eye: No discharge.     Conjunctiva/sclera: Conjunctivae normal.     Pupils: Pupils are equal, round, and reactive to light.  Neck:     Vascular: No carotid bruit.  Cardiovascular:     Rate and Rhythm: Normal rate and regular rhythm.     Pulses: Normal pulses.     Heart sounds: Normal heart sounds.  Pulmonary:     Effort: Pulmonary effort is normal. No respiratory distress.     Breath  sounds: Normal breath sounds.  Abdominal:     General: Abdomen is flat. Bowel sounds are normal.     Palpations: Abdomen is soft.  Musculoskeletal:        General: Normal range of motion.     Cervical back: Normal range of motion.  Skin:  General: Skin is warm and dry.  Neurological:     General: No focal deficit present.     Mental Status: She is alert and oriented to person, place, and time. Mental status is at baseline.  Psychiatric:        Mood and Affect: Mood normal.        Behavior: Behavior normal.        Thought Content: Thought content normal.        Judgment: Judgment normal.      Impression and Plan:  Encounter for preventive health examination  Dyslipidemia -     CBC with Differential/Platelet; Future -     Comprehensive metabolic panel with GFR; Future  Hypothyroidism, unspecified type -     TSH; Future  Vitamin D  deficiency  Other specified hypothyroidism  Morbid obesity (HCC) -     Vitamin B12; Future -     VITAMIN D  25 Hydroxy (Vit-D Deficiency, Fractures); Future  Mixed hyperlipidemia -     Lipid panel; Future  Encounter for screening mammogram for malignant neoplasm of breast -     Digital Screening Mammogram, Left and Right; Future   -Recommend routine eye and dental care. -Healthy lifestyle discussed in detail. -Labs to be updated today. -Prostate cancer screening: N/A Health Maintenance  Topic Date Due   Pap with HPV screening  02/26/2022   Mammogram  06/21/2022   COVID-19 Vaccine (5 - 2024-25 season) 09/16/2022   Flu Shot  08/16/2023   DTaP/Tdap/Td vaccine (2 - Tdap) 02/19/2028   Colon Cancer Screening  06/25/2029   Hepatitis C Screening  Completed   HIV Screening  Completed   Zoster (Shingles) Vaccine  Completed   HPV Vaccine  Aged Out   Meningitis B Vaccine  Aged Out     - Immunizations are up-to-date. - Mammogram requested. - Obtain records from GYN. -Discussed healthy lifestyle, including increased physical activity and  better food choices to promote weight loss.  She is interested in starting a GLP-1 to manage her obesity.  We have discussed side effects and contraindications.  She has no contraindication.  She will call her insurance to see if her employer has a weight loss benefit.  If not, she is considering the [employer] self-directed pharmacy program.  She will let us  know how she would like to proceed.      Marguerita Shih, MD [facility]

## 2023-05-16 ENCOUNTER — Encounter: Payer: Self-pay | Admitting: Internal Medicine

## 2023-05-23 ENCOUNTER — Ambulatory Visit
Admission: RE | Admit: 2023-05-23 | Discharge: 2023-05-23 | Disposition: A | Discharge status: Home / Self Care | Source: Ambulatory Visit | Attending: Internal Medicine | Admitting: Internal Medicine

## 2023-05-23 DIAGNOSIS — Z1231 Encounter for screening mammogram for malignant neoplasm of breast: Secondary | ICD-10-CM

## 2023-07-25 ENCOUNTER — Encounter: Payer: Self-pay | Admitting: Internal Medicine

## 2023-07-25 ENCOUNTER — Ambulatory Visit: Admitting: Internal Medicine

## 2023-07-25 VITALS — BP 120/74 | HR 85 | Temp 97.9°F | Ht 61.0 in | Wt 200.0 lb

## 2023-07-25 DIAGNOSIS — Z6841 Body Mass Index (BMI) 40.0 and over, adult: Secondary | ICD-10-CM | POA: Diagnosis not present

## 2023-07-25 DIAGNOSIS — J069 Acute upper respiratory infection, unspecified: Secondary | ICD-10-CM

## 2023-07-25 DIAGNOSIS — G4733 Obstructive sleep apnea (adult) (pediatric): Secondary | ICD-10-CM | POA: Diagnosis not present

## 2023-07-25 MED ORDER — WEGOVY 0.25 MG/0.5ML ~~LOC~~ SOAJ: 0.2500 mg | SUBCUTANEOUS | 0 refills | Status: DC

## 2023-07-25 NOTE — Progress Notes (Addendum)
 Established Patient Office Visit     CC/Reason for Visit: Discuss acute concerns  HPI: Kelli Ferrell is a 53 y.o. female who is coming in today for the above mentioned reasons.  For the past 7 to 10 days has been experiencing sore throat, postnasal drip, right ear pressure, mouth ulcers.  She visited her dentist and was given triamcinolone dental paste with some relief.  Her symptoms are overall improving but are slow to resolve.  She is inquiring about initiating Wegovy  given her morbid obesity and diagnosed OSA, as she has failed to lose significant body weight despite 3 months of dieting, exercising and behavior modification.   Past Medical/Surgical History: Past Medical History:  Diagnosis Date   Dyslipidemia 11/18/2014   Endometriosis    Fibroid, uterine    GERD (gastroesophageal reflux disease)    High cholesterol    History of colon polyps    History of kidney stones    History of positive PPD 2017   -neg skin test and neg xray -seen by health department and they offered but did not feel treatment was needed - she opted against treatment for POSSIBLE latent TB -in 2017   History of uterine fibroid    Hypothyroidism    followed by pcp   Mild obstructive sleep apnea    study in epic 06-29-2020 , per pt has not decided type of treatment recommended yet   Ovarian cyst    Pelvic pain in female    Seasonal allergies 11/22/2015   SOB (shortness of breath)    Vitamin D  deficiency     Past Surgical History:  Procedure Laterality Date   ABLATION ON ENDOMETRIOSIS N/A 08/26/2020   Procedure: ABLATION ON ENDOMETRIOSIS;  Surgeon: Delana Ted Morrison, DO;  Location: [facility] ;  Service: Gynecology;  Laterality: N/A;   COLONOSCOPY  04/2019   DILATION AND CURETTAGE OF UTERUS  2021   LAPAROSCOPIC LYSIS OF ADHESIONS N/A 08/26/2020   Procedure: LAPAROSCOPIC LYSIS OF ADHESIONS;  Surgeon: Delana Ted Morrison, DO;  Location: [facility];   Service: Gynecology;  Laterality: N/A;   LAPAROSCOPIC OVARIAN CYSTECTOMY Bilateral 08/26/2020   Procedure: LAPAROSCOPIC OVARIAN CYSTECTOMY;  Surgeon: Delana Ted Morrison, DO;  Location: [facility];  Service: Gynecology;  Laterality: Bilateral;   LAPAROSCOPY N/A 08/26/2020   Procedure: LAPAROSCOPY DIAGNOSTIC;  Surgeon: Delana Ted Morrison, DO;  Location: [facility];  Service: Gynecology;  Laterality: N/A;   NASAL SINUS SURGERY  2007   TUBAL LIGATION Bilateral 2001   UTERINE FIBROID EMBOLIZATION  2014    Social History:  reports that she has never smoked. She has never used smokeless tobacco. She reports current alcohol use. She reports that she does not use drugs.  Allergies: Allergies  Allergen Reactions   Sulfamethoxazole-Trimethoprim Other (See Comments)    Other Reaction(s): GI Intolerance    Family History:  Family History  Problem Relation Age of Onset   Hyperlipidemia Mother    Diabetes Mother    Stroke Mother    Hypertension Mother    Thyroid  disease Mother    Sleep apnea Mother    Diabetes Father    Thyroid  cancer Sister    Diabetes Brother    Colon cancer Neg Hx    Colon polyps Neg Hx    Esophageal cancer Neg Hx    Rectal cancer Neg Hx    Stomach cancer Neg Hx      Current Outpatient Medications:    CALCIUM-MAGNESIUM-ZINC PO,  Take 1 tablet by mouth daily., Disp: , Rfl:    cetirizine (ZYRTEC) 10 MG tablet, Take 10 mg by mouth daily., Disp: , Rfl:    Cholecalciferol (VITAMIN D3) 125 MCG (5000 UT) CAPS, Take 1 capsule (5,000 Units total) by mouth daily., Disp: 30 capsule, Rfl:    Chromium Picolinate 800 MCG TABS, Take 800 mcg by mouth daily., Disp: , Rfl:    Coenzyme Q10 (CO Q-10) 100 MG CAPS, Take 100 mg by mouth., Disp: , Rfl:    Cyanocobalamin  (B-12) 2500 MCG TABS, Take 2,500 mcg by mouth., Disp: , Rfl:    levothyroxine  (SYNTHROID ) 100 MCG tablet, TAKE 1 TABLET DAILY BEFORE BREAKFAST, Disp: 90 tablet, Rfl: 1   Melatonin 10 MG  TABS, Take 1 tablet by mouth at bedtime as needed., Disp: , Rfl:    MILK THISTLE PO, Take 1 tablet by mouth daily., Disp: , Rfl:    NON FORMULARY, Desiccated thyroid  extract  2 pills daily, Disp: , Rfl:    Potassium 99 MG TABS, Take 1 tablet by mouth daily. OTC, Disp: , Rfl:    Probiotic Product (PROBIOTIC PO), Take 1 tablet by mouth daily., Disp: , Rfl:    QELBREE 200 MG 24 hr capsule, Take 400 mg by mouth daily., Disp: , Rfl:    Semaglutide -Weight Management (WEGOVY ) 0.25 MG/0.5ML SOAJ, Inject 0.25 mg into the skin once a week., Disp: 2 mL, Rfl: 0   triamcinolone (KENALOG) 0.1 % paste, SMARTSIG:Topical 4-6 Times Daily PRN, Disp: , Rfl:    Turmeric Curcumin 500 MG CAPS, Take 500 mg by mouth daily., Disp: , Rfl:    vitamin E 180 MG (400 UNITS) capsule, Take 400 Units by mouth daily., Disp: , Rfl:    lisdexamfetamine (VYVANSE) 20 MG capsule, Take 20 mg by mouth daily. (Patient not taking: Reported on 07/25/2023), Disp: , Rfl:    mirabegron ER (MYRBETRIQ) 50 MG TB24 tablet, Take 50 mg by mouth daily. (Patient not taking: Reported on 07/25/2023), Disp: , Rfl:    VYVANSE 40 MG capsule, Take 40 mg by mouth daily. (Patient not taking: Reported on 07/25/2023), Disp: , Rfl:   Review of Systems:  Negative unless indicated in HPI.   Physical Exam: Vitals:   07/25/23 0833  BP: 120/74  Pulse: 85  Temp: 97.9 F (36.6 C)  SpO2: 99%  Weight: 200 lb (90.7 kg)  Height: 5' 1 (1.549 m)    Body mass index is 37.79 kg/m.   Physical Exam Vitals reviewed.  Constitutional:      Appearance: Normal appearance.  HENT:     Right Ear: Tympanic membrane, ear canal and external ear normal.     Left Ear: Tympanic membrane, ear canal and external ear normal.     Mouth/Throat:     Mouth: Mucous membranes are moist.     Pharynx: Posterior oropharyngeal erythema present.  Eyes:     Conjunctiva/sclera: Conjunctivae normal.  Cardiovascular:     Rate and Rhythm: Normal rate and regular rhythm.  Pulmonary:      Effort: Pulmonary effort is normal.     Breath sounds: Normal breath sounds.  Neurological:     Mental Status: She is alert.      Impression and Plan:  Upper respiratory tract infection, unspecified type  OSA (obstructive sleep apnea) -     Wegovy ; Inject 0.25 mg into the skin once a week.  Dispense: 2 mL; Refill: 0  Morbid obesity with BMI of 40.0-44.9, adult (HCC) -     Wegovy ;  Inject 0.25 mg into the skin once a week.  Dispense: 2 mL; Refill: 0   -Given exam findings, PNA, pharyngitis, ear infection are not likely, hence abx have not been prescribed. -Have advised rest, fluids, OTC antihistamines, cough suppressants and mucinex. -RTC if no improvement in 10-14 days. - She has morbid obesity with a BMI of 38 as well as sleep apnea, will see if she qualifies for Wegovy .  Time spent:30 minutes reviewing chart, interviewing and examining patient and formulating plan of care.     Tully Theophilus Andrews, MD [facility]

## 2023-08-02 ENCOUNTER — Telehealth: Payer: Self-pay

## 2023-08-02 NOTE — Telephone Encounter (Signed)
 Pharmacy Patient Advocate Encounter   Received notification from CoverMyMeds that prior authorization for Wegovy  0.25MG /0.5ML auto-injectors  is required/requested.   Insurance verification completed.   The patient is insured through [employer] .   Per test claim: PA required; PA started via CoverMyMeds. KEY [id number] . Waiting for clinical questions to populate.

## 2023-08-05 NOTE — Telephone Encounter (Addendum)
 Prior Authorization form/request asks a question that requires your assistance. Please see the question below and advise accordingly. The PA will not be submitted until the necessary information is received.  I did not see in chart that pt has engaged in a trial or behavioral modification and dietary restriction fo 3 months. Can you please check and make sure this corrected  because  plans require patients to try and fail another medication or therapy before they approve an alternative.     Do any of these situations apply? If so, let's make sure the plan knows.  Intolerance or contraindication to an alternative medication or therapy History of failure to an alternative medication or therapy Trial period that didn't yield desired result

## 2023-08-06 ENCOUNTER — Other Ambulatory Visit (HOSPITAL_COMMUNITY): Payer: Self-pay

## 2023-08-06 NOTE — Telephone Encounter (Signed)
 Noted

## 2023-08-06 NOTE — Telephone Encounter (Signed)
 Pharmacy Patient Advocate Encounter  Received notification from [employer] that Prior Authorization for Wegovy  0.25MG /0.5ML auto-injectors has been APPROVED from 07/07/2023/ to 02/17/20226   CaseId:[id number];Status:Approved;Review Type:Prior Auth;Coverage Start Date:07/07/2023;Coverage End Date:03/03/2024;. Authorization Expiration Date: March 03, 2024.   PLEASE BE ADVISED Nat BECKER CPhT Rx Patient Advocate [phone]

## 2023-08-06 NOTE — Telephone Encounter (Signed)
 PLEASE BE ADVISED Clinical questions have been answered and PA submitted.TO PLAN. PA currently Pending.

## 2023-08-13 ENCOUNTER — Encounter: Payer: Self-pay | Admitting: Internal Medicine

## 2023-08-13 DIAGNOSIS — G4733 Obstructive sleep apnea (adult) (pediatric): Secondary | ICD-10-CM

## 2023-08-14 MED ORDER — WEGOVY 0.25 MG/0.5ML ~~LOC~~ SOAJ: 0.2500 mg | SUBCUTANEOUS | 0 refills | Status: DC

## 2023-10-15 ENCOUNTER — Ambulatory Visit: Admitting: Internal Medicine

## 2023-10-15 ENCOUNTER — Encounter: Payer: Self-pay | Admitting: Internal Medicine

## 2023-10-15 VITALS — BP 120/80 | HR 88 | Temp 98.4°F | Wt 192.5 lb

## 2023-10-15 DIAGNOSIS — R21 Rash and other nonspecific skin eruption: Secondary | ICD-10-CM | POA: Diagnosis not present

## 2023-10-15 MED ORDER — TRIAMCINOLONE ACETONIDE 0.1 % EX CREA: 1.0000 | TOPICAL_CREAM | Freq: Two times a day (BID) | CUTANEOUS | 0 refills | Status: DC

## 2023-10-15 NOTE — Progress Notes (Signed)
 Established Patient Office Visit     CC/Reason for Visit: Rash on arms  HPI: Kelli Ferrell is a 53 y.o. female who is coming in today for the above mentioned reasons.  Has had a rash on her arms for the past 3 weeks.  It is pruritic.  It appears to be confluent papules with some superficial excoriation from scratching.  She has tried Benadryl  without relief no new contact irritants that she can think of.  She is allergic to poison ivy but has not been out in the yard.   Past Medical/Surgical History: Past Medical History:  Diagnosis Date   Dyslipidemia 11/18/2014   Endometriosis    Fibroid, uterine    GERD (gastroesophageal reflux disease)    High cholesterol    History of colon polyps    History of kidney stones    History of positive PPD 2017   -neg skin test and neg xray -seen by health department and they offered but did not feel treatment was needed - she opted against treatment for POSSIBLE latent TB -in 2017   History of uterine fibroid    Hypothyroidism    followed by pcp   Mild obstructive sleep apnea    study in epic 06-29-2020 , per pt has not decided type of treatment recommended yet   Ovarian cyst    Pelvic pain in female    Seasonal allergies 11/22/2015   SOB (shortness of breath)    Vitamin D  deficiency     Past Surgical History:  Procedure Laterality Date   ABLATION ON ENDOMETRIOSIS N/A 08/26/2020   Procedure: ABLATION ON ENDOMETRIOSIS;  Surgeon: Delana Ted Morrison, DO;  Location: [facility];  Service: Gynecology;  Laterality: N/A;   COLONOSCOPY  04/2019   DILATION AND CURETTAGE OF UTERUS  2021   LAPAROSCOPIC LYSIS OF ADHESIONS N/A 08/26/2020   Procedure: LAPAROSCOPIC LYSIS OF ADHESIONS;  Surgeon: Delana Ted Morrison, DO;  Location: [facility];  Service: Gynecology;  Laterality: N/A;   LAPAROSCOPIC OVARIAN CYSTECTOMY Bilateral 08/26/2020   Procedure: LAPAROSCOPIC OVARIAN CYSTECTOMY;  Surgeon: Delana Ted Morrison, DO;  Location: [facility];  Service: Gynecology;  Laterality: Bilateral;   LAPAROSCOPY N/A 08/26/2020   Procedure: LAPAROSCOPY DIAGNOSTIC;  Surgeon: Delana Ted Morrison, DO;  [facility];  Service: Gynecology;  Laterality: N/A;   NASAL SINUS SURGERY  2007   TUBAL LIGATION Bilateral 2001   UTERINE FIBROID EMBOLIZATION  2014    Social History:  reports that she has never smoked. She has never used smokeless tobacco. She reports current alcohol use. She reports that she does not use drugs.  Allergies: Allergies  Allergen Reactions   Sulfamethoxazole-Trimethoprim Other (See Comments)    Other Reaction(s): GI Intolerance    Family History:  Family History  Problem Relation Age of Onset   Hyperlipidemia Mother    Diabetes Mother    Stroke Mother    Hypertension Mother    Thyroid  disease Mother    Sleep apnea Mother    Diabetes Father    Thyroid  cancer Sister    Diabetes Brother    Colon cancer Neg Hx    Colon polyps Neg Hx    Esophageal cancer Neg Hx    Rectal cancer Neg Hx    Stomach cancer Neg Hx      Current Outpatient Medications:    CALCIUM-MAGNESIUM-ZINC PO, Take 1 tablet by mouth daily., Disp: , Rfl:    cetirizine (ZYRTEC) 10 MG  tablet, Take 10 mg by mouth daily., Disp: , Rfl:    Cholecalciferol (VITAMIN D3) 125 MCG (5000 UT) CAPS, Take 1 capsule (5,000 Units total) by mouth daily., Disp: 30 capsule, Rfl:    Chromium Picolinate 800 MCG TABS, Take 800 mcg by mouth daily., Disp: , Rfl:    Coenzyme Q10 (CO Q-10) 100 MG CAPS, Take 100 mg by mouth., Disp: , Rfl:    Cyanocobalamin  (B-12) 2500 MCG TABS, Take 2,500 mcg by mouth., Disp: , Rfl:    levothyroxine  (SYNTHROID ) 100 MCG tablet, TAKE 1 TABLET DAILY BEFORE BREAKFAST, Disp: 90 tablet, Rfl: 1   Melatonin 10 MG TABS, Take 1 tablet by mouth at bedtime as needed., Disp: , Rfl:    MILK THISTLE PO, Take 1 tablet by mouth daily., Disp: , Rfl:    mirabegron ER (MYRBETRIQ) 50 MG  TB24 tablet, Take 50 mg by mouth daily., Disp: , Rfl:    NON FORMULARY, Desiccated thyroid  extract  2 pills daily, Disp: , Rfl:    Potassium 99 MG TABS, Take 1 tablet by mouth daily. OTC, Disp: , Rfl:    Probiotic Product (PROBIOTIC PO), Take 1 tablet by mouth daily., Disp: , Rfl:    QELBREE 200 MG 24 hr capsule, Take 400 mg by mouth daily., Disp: , Rfl:    Semaglutide -Weight Management (WEGOVY ) 0.25 MG/0.5ML SOAJ, Inject 0.25 mg into the skin once a week., Disp: 6 mL, Rfl: 0   triamcinolone (KENALOG) 0.1 % paste, SMARTSIG:Topical 4-6 Times Daily PRN, Disp: , Rfl:    triamcinolone cream (KENALOG) 0.1 %, Apply 1 Application topically 2 (two) times daily., Disp: 453 g, Rfl: 0   Turmeric Curcumin 500 MG CAPS, Take 500 mg by mouth daily., Disp: , Rfl:    vitamin E 180 MG (400 UNITS) capsule, Take 400 Units by mouth daily., Disp: , Rfl:   Review of Systems:  Negative unless indicated in HPI.   Physical Exam: Vitals:   10/15/23 1455  BP: 120/80  Pulse: 88  Temp: 98.4 F (36.9 C)  TempSrc: Oral  SpO2: 100%  Weight: 192 lb 8 oz (87.3 kg)    Body mass index is 36.37 kg/m.     Impression and Plan:  Rash -     Triamcinolone Acetonide; Apply 1 Application topically 2 (two) times daily.  Dispense: 453 g; Refill: 0  -Unclear etiology, will send some high potency steroid cream and she will check back if not better within a week.   Time spent:22 minutes reviewing chart, interviewing and examining patient and formulating plan of care.     Tully Theophilus Andrews, MD [facility]

## 2023-10-25 ENCOUNTER — Other Ambulatory Visit: Payer: Self-pay | Admitting: Internal Medicine

## 2023-10-25 DIAGNOSIS — G4733 Obstructive sleep apnea (adult) (pediatric): Secondary | ICD-10-CM

## 2023-10-29 ENCOUNTER — Other Ambulatory Visit: Payer: Self-pay | Admitting: Internal Medicine

## 2023-10-29 DIAGNOSIS — G4733 Obstructive sleep apnea (adult) (pediatric): Secondary | ICD-10-CM

## 2023-10-29 MED ORDER — WEGOVY 0.5 MG/0.5ML ~~LOC~~ SOAJ
0.5000 mg | SUBCUTANEOUS | 0 refills | Status: DC
Start: 2023-10-29 — End: 2023-12-02

## 2023-11-11 NOTE — Progress Notes (Unsigned)
 Name: Kelli Ferrell  MRN/ DOB: [id number], [birth year]    Age/ Sex: 53 y.o., female    PCP: Theophilus Andrews, Tully GRADE, MD   Reason for Endocrinology Evaluation: Hypothyroidism     Date of Initial Endocrinology Evaluation: 05/07/2022    HPI: Ms. Kelli Ferrell is a 53 y.o. female with a past medical history of Dyslipidemia, OSA, hypothyroidism. The patient presented for initial endocrinology clinic visit on 05/07/2022  for consultative assistance with her Hypothyroidism.   Patient has been referred here for further management of hypothyroid which she was diagnosed in 1994 right after the birth of her daughter   She was having difficulty losing weight, she saw the healthy weight and wellness clinic last year, but was not comfortable with the recommendations as she was asked to increase carbohydrate intake  Over the past year her weight has been stable, and she continues to follow a healthy lifestyle She started a research study 10/2021 through [facility] in [place], the study is regarding desiccated thyroid  hormone (progressive medicine) , this is a blinded study and will complete next month.   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  Right gland is normal     Sister with thyroid  cancer  Mother with thyroid  disease initially hypothyroidism followed by hyperthyroidism  + FH of T1DM    Patient with normal 24-hour urine metanephrines as well as cortisol, renin and aldosterone  04/2022  SUBJECTIVE:    Today (11/12/23): Kelli Ferrell is here for follow-up on Hashimoto's thyroiditis as well as left adrenal adenoma   Weight is been stable No palpitations  No depression or anxiety but has stress at work  No local neck swelling  No tremors  No proximal muscle weakness  Has muscle tenderness  No diarrhea but has constipation  No LE edema  No Biotin      HISTORY:  Past Medical History:  Past  Medical History:  Diagnosis Date   Dyslipidemia 11/18/2014   Endometriosis    Fibroid, uterine    GERD (gastroesophageal reflux disease)    High cholesterol    History of colon polyps    History of kidney stones    History of positive PPD 2017   -neg skin test and neg xray -seen by health department and they offered but did not feel treatment was needed - she opted against treatment for POSSIBLE latent TB -in 2017   History of uterine fibroid    Hypothyroidism    followed by pcp   Mild obstructive sleep apnea    study in epic 06-29-2020 , per pt has not decided type of treatment recommended yet   Ovarian cyst    Pelvic pain in female    Seasonal allergies 11/22/2015   SOB (shortness of breath)    Vitamin D  deficiency    Past Surgical History:  Past Surgical History:  Procedure Laterality Date   ABLATION ON ENDOMETRIOSIS N/A 08/26/2020   Procedure: ABLATION ON ENDOMETRIOSIS;  Surgeon: Delana Ted Morrison, DO;  Location: [facility];  Service: Gynecology;  Laterality: N/A;   COLONOSCOPY  04/2019   DILATION AND CURETTAGE OF UTERUS  2021   LAPAROSCOPIC LYSIS OF ADHESIONS N/A 08/26/2020   Procedure: LAPAROSCOPIC LYSIS OF ADHESIONS;  Surgeon: Delana Ted Morrison, DO;  Location: [facility];  Service: Gynecology;  Laterality: N/A;   LAPAROSCOPIC OVARIAN CYSTECTOMY Bilateral 08/26/2020   Procedure: LAPAROSCOPIC OVARIAN CYSTECTOMY;  Surgeon: Delana Ted Morrison,  DO;  Location: [facility];  Service: Gynecology;  Laterality: Bilateral;   LAPAROSCOPY N/A 08/26/2020   Procedure: LAPAROSCOPY DIAGNOSTIC;  Surgeon: Delana Ted Morrison, DO;  Location: [facility];  Service: Gynecology;  Laterality: N/A;   NASAL SINUS SURGERY  2007   TUBAL LIGATION Bilateral 2001   UTERINE FIBROID EMBOLIZATION  2014    Social History:  reports that she has never smoked. She has never used smokeless tobacco. She reports current alcohol use. She  reports that she does not use drugs. Family History: family history includes Diabetes in her brother, father, and mother; Hyperlipidemia in her mother; Hypertension in her mother; Sleep apnea in her mother; Stroke in her mother; Thyroid  cancer in her sister; Thyroid  disease in her mother.   HOME MEDICATIONS: Allergies as of 11/12/2023       Reactions   Sulfamethoxazole-trimethoprim Other (See Comments)   Other Reaction(s): GI Intolerance        Medication List        Accurate as of November 12, 2023 12:09 PM. If you have any questions, ask your nurse or doctor.          B-12 2500 MCG Tabs Take 2,500 mcg by mouth.   CALCIUM-MAGNESIUM-ZINC PO Take 1 tablet by mouth daily.   cetirizine 10 MG tablet Commonly known as: ZYRTEC Take 10 mg by mouth daily.   Chromium Picolinate 800 MCG Tabs Take 800 mcg by mouth daily.   Co Q-10 100 MG Caps Take 100 mg by mouth.   levothyroxine  100 MCG tablet Commonly known as: SYNTHROID  TAKE 1 TABLET DAILY BEFORE BREAKFAST   Melatonin 10 MG Tabs Take 1 tablet by mouth at bedtime as needed.   MILK THISTLE PO Take 1 tablet by mouth daily.   mirabegron ER 50 MG Tb24 tablet Commonly known as: MYRBETRIQ Take 50 mg by mouth daily.   NON FORMULARY Desiccated thyroid  extract  2 pills daily   Potassium 99 MG Tabs Take 1 tablet by mouth daily. OTC   PROBIOTIC PO Take 1 tablet by mouth daily.   Qelbree 200 MG 24 hr capsule Generic drug: viloxazine ER Take 400 mg by mouth daily.   triamcinolone 0.1 % paste Commonly known as: KENALOG SMARTSIG:Topical 4-6 Times Daily PRN   triamcinolone cream 0.1 % Commonly known as: KENALOG Apply 1 Application topically 2 (two) times daily.   Turmeric Curcumin 500 MG Caps Take 500 mg by mouth daily.   Vitamin D3 125 MCG (5000 UT) Caps Take 1 capsule (5,000 Units total) by mouth daily.   vitamin E 180 MG (400 UNITS) capsule Take 400 Units by mouth daily.   Wegovy  0.25 MG/0.5ML Soaj SQ  injection Generic drug: semaglutide -weight management Inject 0.25 mg into the skin once a week.   Wegovy  0.5 MG/0.5ML Soaj SQ injection Generic drug: semaglutide -weight management Inject 0.5 mg into the skin once a week.          REVIEW OF SYSTEMS: A comprehensive ROS was conducted with the patient and is negative except as per HPI      OBJECTIVE:  VS: BP 128/72 (BP Location: Left Arm, Patient Position: Sitting, Cuff Size: Normal)   Pulse 75   Ht 5' 1 (1.549 m)   Wt 192 lb (87.1 kg)   SpO2 99%   BMI 36.28 kg/m    Wt Readings from Last 3 Encounters:  11/12/23 192 lb (87.1 kg)  10/15/23 192 lb 8 oz (87.3 kg)  07/25/23 200 lb (90.7 kg)  EXAM: General: Pt appears well and is in NAD  Neck: General: Supple without adenopathy. Thyroid : Thyroid  size normal.  No goiter or nodules appreciated  Lungs: Clear with good BS bilat   Heart: Auscultation: RRR.  Abdomen:  soft, nontender  Extremities:  BL LE: No pretibial edema   Mental Status: Judgment, insight: Intact Orientation: Oriented to time, place, and person Mood and affect: No depression, anxiety, or agitation     DATA REVIEWED:   Latest Reference Range & Units 11/12/23 12:32  Potassium 3.5 - 5.3 mmol/L 4.7    Latest Reference Range & Units 11/12/23 12:32  TSH mIU/L 1.76     Latest Reference Range & Units 05/14/22 09:19  24 Hour urine volume (VMAHVA) mL 1,600  Cortisol (Ur), Free 4.0 - 50.0 mcg/24 h 21.6  Metanephrine 90 - 315 mcg/24 h 113  Metanephrines, Total 224 - 832 mcg/24 h 379  Normetanephrine 122 - 676 mcg/24 h 266    Latest Reference Range & Units 05/07/22 13:39  ALDOSTERONE 0.0 - 30.0 ng/dL 5.1  ALDOS/RENIN RATIO 0.0 - 30.0  8.3      Latest Reference Range & Units 05/07/22 13:39  Thyroperoxidase Ab SerPl-aCnc 0 - 34 IU/mL 62 (H)    CT abdomen 08/19/2022  Adrenals/Urinary Tract: 19 mm macroscopic fat containing benign left adrenal myelolipoma noted. The adrenal glands are  otherwise unremarkable. The kidneys are normal. The bladder is unremarkable.   Thyroid  ultrasound 06/21/2022  FINDINGS: Parenchymal Echotexture: Moderately heterogenous   Isthmus: 0.3 cm   Right lobe: 3.6 cm x 1.4 cm x 1.3 cm   Left lobe: 3.7 cm x 1.3 cm x 1.3 cm   _________________________________________________________   Estimated total number of nodules >/= 1 cm: 0   Number of spongiform nodules >/=  2 cm not described below (TR1): 0   Number of mixed cystic and solid nodules >/= 1.5 cm not described below (TR2): 0   _________________________________________________________   No discrete nodules are seen within the thyroid  gland.   No adenopathy   IMPRESSION: Heterogeneous thyroid  suggesting medical thyroid  disease.     ASSESSMENT/PLAN/RECOMMENDATIONS:   Hashimoto's Thyroiditis :  -Patient has been noted with elevated anti-TPO antibody at 62 IU/mL 04/2022 -Thyroid  ultrasound did not show any discrete nodules 08/2022 - She was in a trial for desiccated thyroid , her TSH around that time was around 1.5u IU/ML, her TSH 6 months ago was 2.4u IU/ML - I did explain to the patient that a TSH level of 1.5 versus 2.4 should not create that much fatigue and I do believe that her fatigue is due to insomnia - TFTs remain within normal range - Continue levothyroxine  100 mcg daily    2.  Left adrenal myelolipoma  -The risk of hormonal excretion is very little given myelo lipoma -Aldo/renin and 24-hour urinary cortisol, metanephrines were normal 04/2022 - Will proceed with Aldo, renin, 24-hour urinary cortisol - Potassium is normal  3.  Insomnia:  - I suspect this is the main reason for fatigue - I did advise her to discuss this with her PCP - I do not believe changing her levothyroxine  would make much difference and if so we will be just temporary as the main issue is insomnia and needs to be addressed    Follow-up in 1  yr      Signed electronically by: Stefano Redgie Butts, MD  [facility] Endocrinology  [facility] [address] [place] [place], [place] [address] Phone: [phone] FAX: [phone]  CC: Theophilus Andrews, Tully GRADE, MD [address] [place] [address] Phone: [phone] Fax: [phone]   Return to Endocrinology clinic as below: Future Appointments  Date Time Provider Department Center  11/12/2023 12:10 PM Wilian Kwong, Donell Cardinal, MD [facility] None  05/18/2024  7:00 AM Theophilus Andrews, Tully GRADE, MD [facility] [address]

## 2023-11-12 ENCOUNTER — Encounter: Payer: Self-pay | Admitting: Internal Medicine

## 2023-11-12 ENCOUNTER — Other Ambulatory Visit

## 2023-11-12 ENCOUNTER — Ambulatory Visit: Payer: 59 | Admitting: Internal Medicine

## 2023-11-12 VITALS — BP 128/72 | HR 75 | Ht 61.0 in | Wt 192.0 lb

## 2023-11-12 DIAGNOSIS — G4709 Other insomnia: Secondary | ICD-10-CM | POA: Insufficient documentation

## 2023-11-12 DIAGNOSIS — D1779 Benign lipomatous neoplasm of other sites: Secondary | ICD-10-CM | POA: Insufficient documentation

## 2023-11-12 DIAGNOSIS — E063 Autoimmune thyroiditis: Secondary | ICD-10-CM | POA: Diagnosis not present

## 2023-11-12 NOTE — Patient Instructions (Signed)
 24-Hour Urine Collection   You will be collecting your urine for a 24-hour period of time.  Your timer starts with your first urine of the morning (For example - If you first pee at 9AM, your timer will start at 9AM)  Throw away your first urine of the morning  Collect your urine every time you pee for the next 24 hours STOP your urine collection 24 hours after you started the collection (For example - You would stop at 9AM the day after you started)

## 2023-11-13 ENCOUNTER — Ambulatory Visit: Payer: Self-pay | Admitting: Internal Medicine

## 2023-11-28 ENCOUNTER — Encounter: Payer: Self-pay | Admitting: Internal Medicine

## 2023-11-29 LAB — ALDOSTERONE + RENIN ACTIVITY W/ RATIO
ALDO / PRA Ratio: 11.2 ratio (ref 0.9–28.9)
Aldosterone: 21 ng/dL
Renin Activity: 1.88 ng/mL/h (ref 0.25–5.82)

## 2023-11-29 LAB — TSH: TSH: 1.76 m[IU]/L

## 2023-11-29 LAB — POTASSIUM: Potassium: 4.7 mmol/L (ref 3.5–5.3)

## 2023-12-02 ENCOUNTER — Other Ambulatory Visit (HOSPITAL_COMMUNITY): Payer: Self-pay

## 2023-12-02 ENCOUNTER — Telehealth: Payer: Self-pay

## 2023-12-02 ENCOUNTER — Other Ambulatory Visit: Payer: Self-pay | Admitting: Internal Medicine

## 2023-12-02 MED ORDER — ZEPBOUND 2.5 MG/0.5ML ~~LOC~~ SOAJ: 2.5000 mg | SUBCUTANEOUS | 0 refills | Status: DC

## 2023-12-02 NOTE — Telephone Encounter (Signed)
 Noted

## 2023-12-02 NOTE — Telephone Encounter (Signed)
 Pharmacy Patient Advocate Encounter  Received notification from [facility] that Prior Authorization for Zepbound 2.5 has been APPROVED from 11/02/23 to 07/29/24. Ran test claim, Copay is $24.99. This test claim was processed through [facility]- copay amounts may vary at other pharmacies due to pharmacy/plan contracts, or as the patient moves through the different stages of their insurance plan.   PA #/Case ID/Reference #: # [id number]

## 2023-12-02 NOTE — Telephone Encounter (Signed)
 Pharmacy Patient Advocate Encounter   Received notification from Patient Advice Request messages that prior authorization for Zepbound 2.5 is required/requested.   Insurance verification completed.   The patient is insured through [employer].   Per test claim: PA required; PA submitted to above mentioned insurance via Latent Key/confirmation #/EOC [id number] Status is pending

## 2023-12-04 ENCOUNTER — Other Ambulatory Visit (HOSPITAL_COMMUNITY): Payer: Self-pay

## 2023-12-04 ENCOUNTER — Encounter: Payer: Self-pay | Admitting: Internal Medicine

## 2023-12-04 ENCOUNTER — Ambulatory Visit: Admitting: Internal Medicine

## 2023-12-04 VITALS — BP 162/100 | HR 64 | Temp 98.5°F | Wt 190.1 lb

## 2023-12-04 DIAGNOSIS — R030 Elevated blood-pressure reading, without diagnosis of hypertension: Secondary | ICD-10-CM | POA: Diagnosis not present

## 2023-12-04 NOTE — Progress Notes (Signed)
 Established Patient Office Visit     CC/Reason for Visit: Elevated blood pressure readings  HPI: Kelli Ferrell is a 53 y.o. female who is coming in today for the above mentioned reasons.  She has never been diagnosed with hypertension.  Over the past week she has noticed blood pressures at home in the 140-160 systolic range with diastolics in the upper 90s.  Has had some minor headaches.  Is not taking any cold medication.   Past Medical/Surgical History: Past Medical History:  Diagnosis Date   Dyslipidemia 11/18/2014   Endometriosis    Fibroid, uterine    GERD (gastroesophageal reflux disease)    High cholesterol    History of colon polyps    History of kidney stones    History of positive PPD 2017   -neg skin test and neg xray -seen by health department and they offered but did not feel treatment was needed - she opted against treatment for POSSIBLE latent TB -in 2017   History of uterine fibroid    Hypothyroidism    followed by pcp   Mild obstructive sleep apnea    study in epic 06-29-2020 , per pt has not decided type of treatment recommended yet   Ovarian cyst    Pelvic pain in female    Seasonal allergies 11/22/2015   SOB (shortness of breath)    Vitamin D  deficiency     Past Surgical History:  Procedure Laterality Date   ABLATION ON ENDOMETRIOSIS N/A 08/26/2020   Procedure: ABLATION ON ENDOMETRIOSIS;  Surgeon: Delana Ted Morrison, DO;  Location: [facility] [place];  Service: Gynecology;  Laterality: N/A;   COLONOSCOPY  04/2019   DILATION AND CURETTAGE OF UTERUS  2021   LAPAROSCOPIC LYSIS OF ADHESIONS N/A 08/26/2020   Procedure: LAPAROSCOPIC LYSIS OF ADHESIONS;  Surgeon: Delana Ted Morrison, DO;  Location: [facility];  Service: Gynecology;  Laterality: N/A;   LAPAROSCOPIC OVARIAN CYSTECTOMY Bilateral 08/26/2020   Procedure: LAPAROSCOPIC OVARIAN CYSTECTOMY;  Surgeon: Delana Ted Morrison, DO;  Location: [facility];   Service: Gynecology;  Laterality: Bilateral;   LAPAROSCOPY N/A 08/26/2020   Procedure: LAPAROSCOPY DIAGNOSTIC;  Surgeon: Delana Ted Morrison, DO;  Location: [facility];  Service: Gynecology;  Laterality: N/A;   NASAL SINUS SURGERY  2007   TUBAL LIGATION Bilateral 2001   UTERINE FIBROID EMBOLIZATION  2014    Social History:  reports that she has never smoked. She has never used smokeless tobacco. She reports current alcohol use. She reports that she does not use drugs.  Allergies: Allergies  Allergen Reactions   Sulfamethoxazole-Trimethoprim Other (See Comments)    Other Reaction(s): GI Intolerance    Family History:  Family History  Problem Relation Age of Onset   Hyperlipidemia Mother    Diabetes Mother    Stroke Mother    Hypertension Mother    Thyroid  disease Mother    Sleep apnea Mother    Diabetes Father    Thyroid  cancer Sister    Diabetes Brother    Colon cancer Neg Hx    Colon polyps Neg Hx    Esophageal cancer Neg Hx    Rectal cancer Neg Hx    Stomach cancer Neg Hx      Current Outpatient Medications:    CALCIUM-MAGNESIUM-ZINC PO, Take 1 tablet by mouth daily., Disp: , Rfl:    cetirizine (ZYRTEC) 10 MG tablet, Take 10 mg by mouth daily., Disp: , Rfl:    Cholecalciferol (VITAMIN D3)  125 MCG (5000 UT) CAPS, Take 1 capsule (5,000 Units total) by mouth daily., Disp: 30 capsule, Rfl:    Chromium Picolinate 800 MCG TABS, Take 800 mcg by mouth daily., Disp: , Rfl:    Coenzyme Q10 (CO Q-10) 100 MG CAPS, Take 100 mg by mouth., Disp: , Rfl:    Cyanocobalamin  (B-12) 2500 MCG TABS, Take 2,500 mcg by mouth., Disp: , Rfl:    levothyroxine  (SYNTHROID ) 100 MCG tablet, TAKE 1 TABLET DAILY BEFORE BREAKFAST, Disp: 90 tablet, Rfl: 1   Melatonin 10 MG TABS, Take 1 tablet by mouth at bedtime as needed., Disp: , Rfl:    MILK THISTLE PO, Take 1 tablet by mouth daily., Disp: , Rfl:    mirabegron ER (MYRBETRIQ) 50 MG TB24 tablet, Take 50 mg by mouth daily., Disp: ,  Rfl:    NON FORMULARY, Desiccated thyroid  extract  2 pills daily, Disp: , Rfl:    Potassium 99 MG TABS, Take 1 tablet by mouth daily. OTC, Disp: , Rfl:    Probiotic Product (PROBIOTIC PO), Take 1 tablet by mouth daily., Disp: , Rfl:    QELBREE 200 MG 24 hr capsule, Take 400 mg by mouth daily., Disp: , Rfl:    tirzepatide (ZEPBOUND) 2.5 MG/0.5ML Pen, Inject 2.5 mg into the skin once a week., Disp: 2 mL, Rfl: 0   triamcinolone (KENALOG) 0.1 % paste, SMARTSIG:Topical 4-6 Times Daily PRN, Disp: , Rfl:    triamcinolone cream (KENALOG) 0.1 %, Apply 1 Application topically 2 (two) times daily., Disp: 453 g, Rfl: 0   Turmeric Curcumin 500 MG CAPS, Take 500 mg by mouth daily., Disp: , Rfl:    vitamin E 180 MG (400 UNITS) capsule, Take 400 Units by mouth daily., Disp: , Rfl:   Review of Systems:  Negative unless indicated in HPI.   Physical Exam: Vitals:   12/04/23 1433 12/04/23 1437  BP: (!) 160/100 (!) 162/100  Pulse: 64   Temp: 98.5 F (36.9 C)   TempSrc: Oral   SpO2: 98%   Weight: 190 lb 1.6 oz (86.2 kg)     Body mass index is 35.92 kg/m.   Physical Exam Vitals reviewed.  Constitutional:      Appearance: Normal appearance. She is obese.  HENT:     Head: Normocephalic and atraumatic.  Eyes:     Conjunctiva/sclera: Conjunctivae normal.  Cardiovascular:     Rate and Rhythm: Normal rate and regular rhythm.  Pulmonary:     Effort: Pulmonary effort is normal.     Breath sounds: Normal breath sounds.  Skin:    General: Skin is warm and dry.  Neurological:     General: No focal deficit present.     Mental Status: She is alert and oriented to person, place, and time.  Psychiatric:        Mood and Affect: Mood normal.        Behavior: Behavior normal.        Thought Content: Thought content normal.        Judgment: Judgment normal.      Impression and Plan:  Elevated BP reading w/ no diagnosis of HTN   - Has had elevated blood pressures at home as well as in office  today.  Interestingly she has been seen in a couple different offices in the last 6 weeks and her blood pressures have been normal then as they have always been.  Have advised that she continue to check for now and she will schedule a follow-up  appointment in 4 to 6 weeks.  If blood pressures remain elevated we will commence treatment at that time.  Time spent:22 minutes reviewing chart, interviewing and examining patient and formulating plan of care.     Tully Theophilus Andrews, MD [facility]

## 2023-12-06 ENCOUNTER — Other Ambulatory Visit

## 2023-12-13 LAB — CORTISOL, URINE, 24 HOUR
24 Hour urine volume (VMAHVA): 1250 mL
CREATININE, URINE: 1.41 g/(24.h) (ref 0.50–2.15)
Cortisol (Ur), Free: 31.8 ug/(24.h) (ref 4.0–50.0)

## 2023-12-25 ENCOUNTER — Other Ambulatory Visit: Payer: Self-pay | Admitting: Internal Medicine

## 2024-01-01 ENCOUNTER — Encounter: Payer: Self-pay | Admitting: Internal Medicine

## 2024-01-01 ENCOUNTER — Ambulatory Visit: Admitting: Internal Medicine

## 2024-01-01 VITALS — BP 120/84 | HR 82 | Temp 98.1°F | Wt 193.6 lb

## 2024-01-01 DIAGNOSIS — I10 Essential (primary) hypertension: Secondary | ICD-10-CM | POA: Diagnosis not present

## 2024-01-01 DIAGNOSIS — Z6836 Body mass index (BMI) 36.0-36.9, adult: Secondary | ICD-10-CM | POA: Diagnosis not present

## 2024-01-01 MED ORDER — ZEPBOUND 5 MG/0.5ML ~~LOC~~ SOAJ: 5.0000 mg | SUBCUTANEOUS | 0 refills | Status: DC

## 2024-01-01 MED ORDER — AMLODIPINE BESYLATE 5 MG PO TABS: 5.0000 mg | ORAL_TABLET | Freq: Every day | ORAL | 1 refills | Status: AC

## 2024-01-01 NOTE — Progress Notes (Signed)
 Established Patient Office Visit     CC/Reason for Visit: Blood pressure follow-up  HPI: Kelli Ferrell is a 53 y.o. female who is coming in today for the above mentioned reasons. Past Medical History is significant for: Hypertension, morbid obesity among other issues.  Requesting increase in Zepbound  dosing.  She is here today to follow-up on blood pressure.  She has not yet been diagnosed.  Her ADHD provider started her on clonidine due to elevated blood pressure.  She has only been on it for about a week.  She notices significant headaches when her blood pressure is elevated.  Her mother had a CVA at age 28.  These are her ambulatory measurements:     Past Medical/Surgical History: Past Medical History:  Diagnosis Date   Dyslipidemia 11/18/2014   Endometriosis    Fibroid, uterine    GERD (gastroesophageal reflux disease)    High cholesterol    History of colon polyps    History of kidney stones    History of positive PPD 2017   -neg skin test and neg xray -seen by health department and they offered but did not feel treatment was needed - she opted against treatment for POSSIBLE latent TB -in 2017   History of uterine fibroid    Hypothyroidism    followed by pcp   Mild obstructive sleep apnea    study in epic 06-29-2020 , per pt has not decided type of treatment recommended yet   Ovarian cyst    Pelvic pain in female    Seasonal allergies 11/22/2015   SOB (shortness of breath)    Vitamin D  deficiency     Past Surgical History:  Procedure Laterality Date   ABLATION ON ENDOMETRIOSIS N/A 08/26/2020   Procedure: ABLATION ON ENDOMETRIOSIS;  Surgeon: Delana Ted Morrison, DO;  Location: [facility];  Service: Gynecology;  Laterality: N/A;   COLONOSCOPY  04/2019   DILATION AND CURETTAGE OF UTERUS  2021   LAPAROSCOPIC LYSIS OF ADHESIONS N/A 08/26/2020   Procedure: LAPAROSCOPIC LYSIS OF ADHESIONS;  Surgeon: Delana Ted Morrison, DO;  Location: [place];  Service: Gynecology;  Laterality: N/A;   LAPAROSCOPIC OVARIAN CYSTECTOMY Bilateral 08/26/2020   Procedure: LAPAROSCOPIC OVARIAN CYSTECTOMY;  Surgeon: Delana Ted Morrison, DO;  Location: [facility];  Service: Gynecology;  Laterality: Bilateral;   LAPAROSCOPY N/A 08/26/2020   Procedure: LAPAROSCOPY DIAGNOSTIC;  Surgeon: Delana Ted Morrison, DO;  Location: [facility];  Service: Gynecology;  Laterality: N/A;   NASAL SINUS SURGERY  2007   TUBAL LIGATION Bilateral 2001   UTERINE FIBROID EMBOLIZATION  2014    Social History:  reports that she has never smoked. She has never used smokeless tobacco. She reports current alcohol use. She reports that she does not use drugs.  Allergies: Allergies[1]  Family History:  Family History  Problem Relation Age of Onset   Hyperlipidemia Mother    Diabetes Mother    Stroke Mother    Hypertension Mother    Thyroid  disease Mother    Sleep apnea Mother    Diabetes Father    Thyroid  cancer Sister    Diabetes Brother    Colon cancer Neg Hx    Colon polyps Neg Hx    Esophageal cancer Neg Hx    Rectal cancer Neg Hx    Stomach cancer Neg Hx     Current Medications[2]  Review of Systems:  Negative unless indicated in HPI.   Physical Exam: Vitals:  01/01/24 0832  BP: 120/84  Pulse: 82  Temp: 98.1 F (36.7 C)  TempSrc: Oral  SpO2: 98%  Weight: 193 lb 9.6 oz (87.8 kg)    Body mass index is 36.58 kg/m.   Physical Exam Vitals reviewed.  Constitutional:      Appearance: Normal appearance. She is obese.  HENT:     Head: Normocephalic and atraumatic.  Eyes:     Conjunctiva/sclera: Conjunctivae normal.  Cardiovascular:     Rate and Rhythm: Normal rate and regular rhythm.  Pulmonary:     Effort: Pulmonary effort is normal.     Breath sounds: Normal breath sounds.  Skin:    General: Skin is warm and dry.  Neurological:     General: No focal deficit present.     Mental Status:  She is alert and oriented to person, place, and time.  Psychiatric:        Mood and Affect: Mood normal.        Behavior: Behavior normal.        Thought Content: Thought content normal.        Judgment: Judgment normal.      Impression and Plan:  Primary hypertension -     amLODIPine  Besylate; Take 1 tablet (5 mg total) by mouth daily.  Dispense: 90 tablet; Refill: 1  Morbid obesity (HCC) -     Zepbound ; Inject 5 mg into the skin once a week.  Dispense: 2 mL; Refill: 0   - Given ambulatory measurements I will go ahead and diagnose her as hypertensive.  Start amlodipine  5 mg daily, wean off clonidine as I do not like that as a first-line treatment for hypertension. - Increase Zepbound  to 5 mg.  Time spent:32 minutes reviewing chart, interviewing and examining patient and formulating plan of care.     Tully Theophilus Andrews, MD [facility]     [1]  Allergies Allergen Reactions   Sulfamethoxazole-Trimethoprim Other (See Comments)    Other Reaction(s): GI Intolerance  [2]  Current Outpatient Medications:    CALCIUM-MAGNESIUM-ZINC PO, Take 1 tablet by mouth daily., Disp: , Rfl:    cetirizine (ZYRTEC) 10 MG tablet, Take 10 mg by mouth daily., Disp: , Rfl:    Cholecalciferol (VITAMIN D3) 125 MCG (5000 UT) CAPS, Take 1 capsule (5,000 Units total) by mouth daily., Disp: 30 capsule, Rfl:    Chromium Picolinate 800 MCG TABS, Take 800 mcg by mouth daily., Disp: , Rfl:    cloNIDine (CATAPRES) 0.1 MG tablet, Take 0.1 mg by mouth at bedtime., Disp: , Rfl:    Coenzyme Q10 (CO Q-10) 100 MG CAPS, Take 100 mg by mouth., Disp: , Rfl:    Cyanocobalamin  (B-12) 2500 MCG TABS, Take 2,500 mcg by mouth., Disp: , Rfl:    levothyroxine  (SYNTHROID ) 100 MCG tablet, TAKE 1 TABLET DAILY BEFORE BREAKFAST, Disp: 90 tablet, Rfl: 1   Melatonin 10 MG TABS, Take 1 tablet by mouth at bedtime as needed., Disp: , Rfl:    MILK THISTLE PO, Take 1 tablet by mouth daily., Disp: , Rfl:     mirabegron ER (MYRBETRIQ) 50 MG TB24 tablet, Take 50 mg by mouth daily., Disp: , Rfl:    NON FORMULARY, Desiccated thyroid  extract  2 pills daily, Disp: , Rfl:    Potassium 99 MG TABS, Take 1 tablet by mouth daily. OTC, Disp: , Rfl:    Probiotic Product (PROBIOTIC PO), Take 1 tablet by mouth daily., Disp: , Rfl:    QELBREE 200 MG  24 hr capsule, Take 400 mg by mouth daily., Disp: , Rfl:    tirzepatide  (ZEPBOUND ) 5 MG/0.5ML Pen, Inject 5 mg into the skin once a week., Disp: 2 mL, Rfl: 0   triamcinolone  (KENALOG ) 0.1 % paste, SMARTSIG:Topical 4-6 Times Daily PRN, Disp: , Rfl:    triamcinolone  cream (KENALOG ) 0.1 %, Apply 1 Application topically 2 (two) times daily., Disp: 453 g, Rfl: 0   Turmeric Curcumin 500 MG CAPS, Take 500 mg by mouth daily., Disp: , Rfl:    vitamin E 180 MG (400 UNITS) capsule, Take 400 Units by mouth daily., Disp: , Rfl:    amLODipine  (NORVASC ) 5 MG tablet, Take 1 tablet (5 mg total) by mouth daily., Disp: 90 tablet, Rfl: 1

## 2024-01-07 ENCOUNTER — Encounter: Payer: Self-pay | Admitting: Internal Medicine

## 2024-01-24 ENCOUNTER — Other Ambulatory Visit: Payer: Self-pay | Admitting: Internal Medicine

## 2024-02-11 ENCOUNTER — Telehealth: Payer: Self-pay | Admitting: Pharmacist

## 2024-02-11 NOTE — Telephone Encounter (Signed)
 Pharmacy Patient Advocate Encounter   Received notification from CoverMyMeds that prior authorization for Wegovy  is due for renewal.   Insurance verification completed.    Action: Medication has been discontinued. Archived Key: [id number]  Per chart notes, pt now on Zepbound 

## 2024-02-12 ENCOUNTER — Encounter: Payer: Self-pay | Admitting: Internal Medicine

## 2024-02-12 ENCOUNTER — Ambulatory Visit: Admitting: Internal Medicine

## 2024-02-12 VITALS — BP 111/86 | HR 95 | Temp 98.2°F | Wt 182.0 lb

## 2024-02-12 DIAGNOSIS — I10 Essential (primary) hypertension: Secondary | ICD-10-CM | POA: Diagnosis not present

## 2024-02-12 DIAGNOSIS — E039 Hypothyroidism, unspecified: Secondary | ICD-10-CM | POA: Diagnosis not present

## 2024-02-12 MED ORDER — VALSARTAN 80 MG PO TABS: 80.0000 mg | ORAL_TABLET | Freq: Every day | ORAL | 1 refills | Status: AC

## 2024-02-12 MED ORDER — LEVOTHYROXINE SODIUM 100 MCG PO TABS: ORAL_TABLET | ORAL | 1 refills | Status: AC

## 2024-02-12 NOTE — Progress Notes (Signed)
 "    Established Patient Office Visit     CC/Reason for Visit: Blood pressure follow-up  HPI: Kelli Ferrell is a 54 y.o. female who is coming in today for the above mentioned reasons. Past Medical History is significant for: Newly diagnosed hypertension.  At last visit she was started on amlodipine  and weaned off clonidine that was started by different provider.  Systolics seem to be within range but diastolics remain in the mid 90s to low 100s.  Headaches have resolved.  She is feeling overall well.   Past Medical/Surgical History: Past Medical History:  Diagnosis Date   Dyslipidemia 11/18/2014   Endometriosis    Fibroid, uterine    GERD (gastroesophageal reflux disease)    High cholesterol    History of colon polyps    History of kidney stones    History of positive PPD 2017   -neg skin test and neg xray -seen by health department and they offered but did not feel treatment was needed - she opted against treatment for POSSIBLE latent TB -in 2017   History of uterine fibroid    Hypothyroidism    followed by pcp   Mild obstructive sleep apnea    study in epic 06-29-2020 , per pt has not decided type of treatment recommended yet   Ovarian cyst    Pelvic pain in female    Seasonal allergies 11/22/2015   SOB (shortness of breath)    Vitamin D  deficiency     Past Surgical History:  Procedure Laterality Date   ABLATION ON ENDOMETRIOSIS N/A 08/26/2020   Procedure: ABLATION ON ENDOMETRIOSIS;  Surgeon: Delana Ted Morrison, DO;  Location: [facility];  Service: Gynecology;  Laterality: N/A;   COLONOSCOPY  04/2019   DILATION AND CURETTAGE OF UTERUS  2021   LAPAROSCOPIC LYSIS OF ADHESIONS N/A 08/26/2020   Procedure: LAPAROSCOPIC LYSIS OF ADHESIONS;  Surgeon: Delana Ted Morrison, DO;  Location: [facility];  Service: Gynecology;  Laterality: N/A;   LAPAROSCOPIC OVARIAN CYSTECTOMY Bilateral 08/26/2020   Procedure: LAPAROSCOPIC OVARIAN CYSTECTOMY;   Surgeon: Delana Ted Morrison, DO;  Location: [facility];  Service: Gynecology;  Laterality: Bilateral;   LAPAROSCOPY N/A 08/26/2020   Procedure: LAPAROSCOPY DIAGNOSTIC;  Surgeon: Delana Ted Morrison, DO;  Location: [facility];  Service: Gynecology;  Laterality: N/A;   NASAL SINUS SURGERY  2007   TUBAL LIGATION Bilateral 2001   UTERINE FIBROID EMBOLIZATION  2014    Social History:  reports that she has never smoked. She has never used smokeless tobacco. She reports current alcohol use. She reports that she does not use drugs.  Allergies: Allergies[1]  Family History:  Family History  Problem Relation Age of Onset   Hyperlipidemia Mother    Diabetes Mother    Stroke Mother    Hypertension Mother    Thyroid  disease Mother    Sleep apnea Mother    Diabetes Father    Thyroid  cancer Sister    Diabetes Brother    Colon cancer Neg Hx    Colon polyps Neg Hx    Esophageal cancer Neg Hx    Rectal cancer Neg Hx    Stomach cancer Neg Hx     Current Medications[2]  Review of Systems:  Negative unless indicated in HPI.   Physical Exam: Vitals:   02/12/24 0839 02/12/24 0843  BP: (!) 112/96 111/86  Pulse: 95   Temp: 98.2 F (36.8 C)   TempSrc: Oral   SpO2: 98%  Weight: 182 lb (82.6 kg)     Body mass index is 34.39 kg/m.   Physical Exam Vitals reviewed.  Constitutional:      Appearance: Normal appearance.  HENT:     Head: Normocephalic and atraumatic.  Eyes:     Conjunctiva/sclera: Conjunctivae normal.  Cardiovascular:     Rate and Rhythm: Normal rate and regular rhythm.  Pulmonary:     Effort: Pulmonary effort is normal.     Breath sounds: Normal breath sounds.  Skin:    General: Skin is warm and dry.  Neurological:     General: No focal deficit present.     Mental Status: She is alert and oriented to person, place, and time.  Psychiatric:        Mood and Affect: Mood normal.        Behavior: Behavior normal.         Thought Content: Thought content normal.        Judgment: Judgment normal.      Impression and Plan:  Primary hypertension -     Valsartan ; Take 1 tablet (80 mg total) by mouth daily.  Dispense: 90 tablet; Refill: 1  Hypothyroidism, unspecified type -     Levothyroxine  Sodium; TAKE 1 TABLET DAILY BEFORE BREAKFAST  Dispense: 90 tablet; Refill: 1  -Blood pressure, particularly diastolic, remains elevated beyond goal.  Continue amlodipine  5 mg and start valsartan  80 mg.  She has now fully weaned off clonidine. - Refill levothyroxine .   Time spent:31 minutes reviewing chart, interviewing and examining patient and formulating plan of care.     Tully Theophilus Andrews, MD [facility]     [1]  Allergies Allergen Reactions   Sulfamethoxazole-Trimethoprim Other (See Comments)    Other Reaction(s): GI Intolerance  [2]  Current Outpatient Medications:    amLODipine  (NORVASC ) 5 MG tablet, Take 1 tablet (5 mg total) by mouth daily., Disp: 90 tablet, Rfl: 1   CALCIUM-MAGNESIUM-ZINC PO, Take 1 tablet by mouth daily., Disp: , Rfl:    cetirizine (ZYRTEC) 10 MG tablet, Take 10 mg by mouth daily., Disp: , Rfl:    Cholecalciferol (VITAMIN D3) 125 MCG (5000 UT) CAPS, Take 1 capsule (5,000 Units total) by mouth daily., Disp: 30 capsule, Rfl:    Chromium Picolinate 800 MCG TABS, Take 800 mcg by mouth daily., Disp: , Rfl:    Coenzyme Q10 (CO Q-10) 100 MG CAPS, Take 100 mg by mouth., Disp: , Rfl:    Cyanocobalamin  (B-12) 2500 MCG TABS, Take 2,500 mcg by mouth., Disp: , Rfl:    Melatonin 10 MG TABS, Take 1 tablet by mouth at bedtime as needed., Disp: , Rfl:    MILK THISTLE PO, Take 1 tablet by mouth daily., Disp: , Rfl:    NON FORMULARY, Desiccated thyroid  extract  2 pills daily, Disp: , Rfl:    Potassium 99 MG TABS, Take 1 tablet by mouth daily. OTC, Disp: , Rfl:    QELBREE 200 MG 24 hr capsule, Take 400 mg by mouth daily., Disp: , Rfl:    triamcinolone  (KENALOG ) 0.1 %  paste, SMARTSIG:Topical 4-6 Times Daily PRN, Disp: , Rfl:    Turmeric Curcumin 500 MG CAPS, Take 500 mg by mouth daily., Disp: , Rfl:    valsartan  (DIOVAN ) 80 MG tablet, Take 1 tablet (80 mg total) by mouth daily., Disp: 90 tablet, Rfl: 1   vitamin E 180 MG (400 UNITS) capsule, Take 400 Units by mouth daily., Disp: , Rfl:    cloNIDine (  CATAPRES) 0.1 MG tablet, Take 0.1 mg by mouth at bedtime. (Patient not taking: Reported on 02/12/2024), Disp: , Rfl:    levothyroxine  (SYNTHROID ) 100 MCG tablet, TAKE 1 TABLET DAILY BEFORE BREAKFAST, Disp: 90 tablet, Rfl: 1   mirabegron ER (MYRBETRIQ) 50 MG TB24 tablet, Take 50 mg by mouth daily. (Patient not taking: Reported on 02/12/2024), Disp: , Rfl:    Probiotic Product (PROBIOTIC PO), Take 1 tablet by mouth daily. (Patient not taking: Reported on 02/12/2024), Disp: , Rfl:   "

## 2024-03-25 ENCOUNTER — Ambulatory Visit: Admitting: Internal Medicine

## 2024-05-18 ENCOUNTER — Encounter: Admitting: Internal Medicine

## 2024-11-11 ENCOUNTER — Ambulatory Visit: Admitting: Internal Medicine
